# Patient Record
Sex: Male | Born: 1980 | Race: White | Hispanic: No | Marital: Married | Smoking: Never smoker
Health system: Southern US, Community
[De-identification: ages and names within clinical notes are randomized; demographics above are authoritative.]

## PROBLEM LIST (undated history)

## (undated) DIAGNOSIS — K219 Gastro-esophageal reflux disease without esophagitis: Secondary | ICD-10-CM

## (undated) DIAGNOSIS — G4733 Obstructive sleep apnea (adult) (pediatric): Secondary | ICD-10-CM

## (undated) DIAGNOSIS — E669 Obesity, unspecified: Secondary | ICD-10-CM

## (undated) DIAGNOSIS — R748 Abnormal levels of other serum enzymes: Secondary | ICD-10-CM

## (undated) DIAGNOSIS — Z9989 Dependence on other enabling machines and devices: Secondary | ICD-10-CM

## (undated) DIAGNOSIS — T7840XA Allergy, unspecified, initial encounter: Secondary | ICD-10-CM

## (undated) DIAGNOSIS — M722 Plantar fascial fibromatosis: Secondary | ICD-10-CM

## (undated) HISTORY — DX: Dependence on other enabling machines and devices: Z99.89

## (undated) HISTORY — DX: Obesity, unspecified: E66.9

## (undated) HISTORY — PX: EYE SURGERY: SHX253

## (undated) HISTORY — DX: Allergy, unspecified, initial encounter: T78.40XA

## (undated) HISTORY — DX: Obstructive sleep apnea (adult) (pediatric): G47.33

## (undated) HISTORY — DX: Plantar fascial fibromatosis: M72.2

## (undated) HISTORY — DX: Gastro-esophageal reflux disease without esophagitis: K21.9

---

## 2012-04-27 ENCOUNTER — Ambulatory Visit (INDEPENDENT_AMBULATORY_CARE_PROVIDER_SITE_OTHER): Payer: BC Managed Care – PPO | Admitting: Physician Assistant

## 2012-04-27 VITALS — BP 122/81 | HR 88 | Temp 98.1°F | Resp 16 | Ht 71.0 in | Wt 273.0 lb

## 2012-04-27 DIAGNOSIS — K219 Gastro-esophageal reflux disease without esophagitis: Secondary | ICD-10-CM

## 2012-04-27 DIAGNOSIS — J309 Allergic rhinitis, unspecified: Secondary | ICD-10-CM

## 2012-04-27 DIAGNOSIS — J069 Acute upper respiratory infection, unspecified: Secondary | ICD-10-CM

## 2012-04-27 DIAGNOSIS — G4733 Obstructive sleep apnea (adult) (pediatric): Secondary | ICD-10-CM

## 2012-04-27 DIAGNOSIS — Z9989 Dependence on other enabling machines and devices: Secondary | ICD-10-CM | POA: Insufficient documentation

## 2012-04-27 MED ORDER — IPRATROPIUM BROMIDE 0.03 % NA SOLN: 2.0000 | Freq: Two times a day (BID) | NASAL | Status: DC

## 2012-04-27 MED ORDER — BENZONATATE 100 MG PO CAPS: 100.0000 mg | ORAL_CAPSULE | Freq: Three times a day (TID) | ORAL | Status: DC | PRN

## 2012-04-27 MED ORDER — GUAIFENESIN ER 1200 MG PO TB12: 1.0000 | ORAL_TABLET | Freq: Two times a day (BID) | ORAL | Status: DC | PRN

## 2012-04-27 NOTE — Progress Notes (Signed)
Subjective:    Patient ID: Steven Lee, male    DOB: 1980/11/23, 32 y.o.   MRN: [id number]  HPI This 31 y.o. male presents for evaluation of a slight cough and allergies or sinus.  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  Has taken Nyquil the last few nights with a little bit of improvement.  Symptoms began 04/23/2012, were worst on 04/25/2012.  No fever, chills, GI/GU symptoms.  No unexplained muscle pain or joint pain.   No HA, dizziness, facial pain or dental pain.  He has self diagnoses OSA and uses a CPAP that he borrows from a family member.  He notes it's not working as well as it used to and would like to have formal evaluation.  He intends to establish with a PCP soon.  Review of Systems As above.  No chest pain, SOB, vision changes.   Past Medical History  Diagnosis Date  . Allergy   . GERD (gastroesophageal reflux disease)     Past Surgical History  Procedure Date  . Eye surgery     PRK bilaterally    Prior to Admission medications   Medication Sig Start Date End Date Taking? Authorizing Provider  omeprazole (PRILOSEC OTC) 20 MG tablet Take 20 mg by mouth daily.   Yes Historical Provider, MD  benzonatate (TESSALON) 100 MG capsule Take 1-2 capsules (100-200 mg total) by mouth 3 (three) times daily as needed for cough. 04/27/12   Deontrae Drinkard S Camarie Mctigue, PA-C  Guaifenesin (MUCINEX MAXIMUM STRENGTH) 1200 MG TB12 Take 1 tablet (1,200 mg total) by mouth every 12 (twelve) hours as needed. 04/27/12   Brodi Nery S Diane Mochizuki, PA-C  ipratropium (ATROVENT) 0.03 % nasal spray Place 2 sprays into the nose 2 (two) times daily. 04/27/12   Crystallee Werden Tessa Lerner, PA-C  Multiple Vitamin (MULTIVITAMIN) tablet Take 1 tablet by mouth daily.   Yes Historical Provider, MD    No Known Allergies  History   Social History  . Marital Status: Single    Spouse Name: Steven Lee    Number of Children: 0  . Years of Education: 16   Occupational History  . 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 Smoking status: Never Smoker   . Smokeless tobacco: Never Used  . Alcohol Use: 1.0 - 1.5 oz/week    2-3 drink(s) per week  . Drug Use: No  . Sexually Active: Yes -- Male partner(s)   Other Topics Concern  . Not on file   Social History Narrative   Lives with his fiance    Family History  Problem Relation Age of Onset  . Mental illness Sister     bipolar       Objective:   Physical Exam Blood pressure 122/81, pulse 88, temperature 98.1 F (36.7 C), temperature source Oral, resp. rate 16, height 5\' 11"  (1.803 m), weight 273 lb (123.832 kg), SpO2 97.00%. Body mass index is 38.08 kg/(m^2). Well-developed, well nourished WM who is awake, alert and oriented, in NAD. HEENT: [place]/AT, PERRL, EOMI.  Sclera and conjunctiva are clear.  EAC are patent, TMs are normal in appearance. Nasal mucosa is pink and moist. OP is clear. No frontal or maxillary sinus tenderness. Neck: supple, non-tender, no lymphadenopathy, thyromegaly. Heart: RRR, no murmur Lungs: normal effort, CTA Extremities: no cyanosis, clubbing or edema. Skin: warm and dry without rash. Psychologic: good mood and appropriate affect, normal speech and behavior.  Assessment & Plan:   1. AR (allergic rhinitis)    2. GERD (gastroesophageal reflux disease)    3. Acute upper respiratory infections of unspecified site  benzonatate (TESSALON) 100 MG capsule, ipratropium (ATROVENT) 0.03 % nasal spray, Guaifenesin (MUCINEX MAXIMUM STRENGTH) 1200 MG TB12  4. OSA on CPAP  He needs a sleep study to titrate-will establish with PCP, but if it's going to be several months, he'll call and I'll make the referral.

## 2012-04-27 NOTE — Patient Instructions (Signed)
Get plenty of rest and drink at least 64 ounces of water daily. 

## 2012-10-02 ENCOUNTER — Encounter: Payer: Self-pay | Admitting: Neurology

## 2012-10-08 NOTE — Progress Notes (Signed)
Quick Note:  100% compliance. With CPAP. Will discuss during visit.   ______

## 2012-11-06 ENCOUNTER — Institutional Professional Consult (permissible substitution): Payer: BC Managed Care – PPO | Admitting: Neurology

## 2012-11-09 ENCOUNTER — Encounter: Payer: Self-pay | Admitting: Neurology

## 2012-11-09 ENCOUNTER — Ambulatory Visit (INDEPENDENT_AMBULATORY_CARE_PROVIDER_SITE_OTHER): Payer: BC Managed Care – PPO | Admitting: Neurology

## 2012-11-09 VITALS — BP 136/84 | HR 67 | Resp 14 | Ht 70.5 in | Wt 280.0 lb

## 2012-11-09 DIAGNOSIS — G4733 Obstructive sleep apnea (adult) (pediatric): Secondary | ICD-10-CM

## 2012-11-09 DIAGNOSIS — Z9989 Dependence on other enabling machines and devices: Secondary | ICD-10-CM

## 2012-11-09 NOTE — Assessment & Plan Note (Signed)
Cont use CPAP.  BMI risk factor.

## 2012-11-09 NOTE — Progress Notes (Addendum)
[facility]  Provider:  Dr Vickey Huger Referring Provider: No ref. provider found Primary Care Physician:  Alysia Penna, MD  Chief Complaint  Patient presents with  . Neurologic Problem    Sleep.Marland KitchenRM#11    HPI:  Steven Lee is a 32 y.o. male here as a referral from Dr.Holwerda.  This 32 year old Caucasian right-handed male was originally referred for a sleep study on 06/05/2012. The patient had a past medical history of obesity, GE RD, plantar fasciitis. He also reported having been diagnosed with obstructive sleep apnea and had been using a CPAP machine that he acquired from a family member. He now needed a formal sleep evaluation to have the machine set to his knee. He presented with the complaints of excessive daytime sleepiness, snoring and witnessed apneas. He endorsed the sleepiness score at 14 points the backs score at 7 pints.  The sleep study showed a a HI also known as apnea hypotony index of 97 and an RDI even higher.  the patient's lowest oxygen level was 71%. The average heart rate was 53 beats per minute the patient was titrated to CPAP and responded well to a pressure of 10 cm water. The patient slept mostly in supine position and during the titration only in supine position. I ordered the CPAP to be set at 11 cm water was a 2 cm EPI. I obtained a detailed download from his machine through [facility] , which shows an average AHI of 2.9 at a CPAP pressure of 11 cm, and average usage of the machine at night for 7 hours 53 minutes the 30 day download shows 100% compliance.  Epworth is now 7 Points.  Review of Systems: Out of a complete 14 system review, the patient complains of only the following symptoms, and all other reviewed systems are negative. Sleepiness reduced.snoring and apnea resolved.   The patient endorses a bedtime between 10:30 and 11:30 the am and and has to get up at 5:30 AM.. His fiance has reported less restless sleep as well as  regional snoring and reduced apneas. The   History   Social History  . Marital Status: Single    Spouse Name: [name]    Number of Children: 0  . Years of Education: 16   Occupational History  . 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 Smoking status: Never Smoker   . Smokeless tobacco: Never Used  . Alcohol Use: 1 - 1.5 oz/week    2-3 drink(s) per week  . Drug Use: No  . Sexually Active: Yes -- Male partner(s)   Other Topics Concern  . Not on file   Social History Narrative   Lives with his fiance    Family History  Problem Relation Age of Onset  . Mental illness Sister     bipolar    Past Medical History  Diagnosis Date  . Allergy   . GERD (gastroesophageal reflux disease)   . OSA on CPAP   . Obesity   . Plantar fasciitis     Past Surgical History  Procedure Laterality Date  . Eye surgery      PRK bilaterally    Current Outpatient Prescriptions  Medication Sig Dispense Refill  . Multiple Vitamin (MULTIVITAMIN) tablet Take 1 tablet by mouth daily.      Marland Kitchen omeprazole (PRILOSEC) 20 MG capsule Take 20 mg by mouth daily.       No current facility-administered medications for this  visit.    Allergies as of 11/09/2012  . (No Known Allergies)    Vitals: BP 136/84  Pulse 67  Resp 14  Ht 5' 10.5" (1.791 m)  Wt 280 lb (127.007 kg)  BMI 39.59 kg/m2 Last Weight:  Wt Readings from Last 1 Encounters:  11/09/12 280 lb (127.007 kg)   Last Height:   Ht Readings from Last 1 Encounters:  11/09/12 5' 10.5" (1.791 m)   Vision Screening:  Vitals .  Physical exam:  General: The patient is awake, alert and appears not in acute distress. The patient is well groomed. Head: Normocephalic, atraumatic. Neck is supple. Mallampati 1- neck circumference:18 inches, TMJ positive , clicking in the right. No retrognathia.  Cardiovascular:  Regular rate and rhythm, without  murmurs or carotid bruit, and  without distended neck veins. Respiratory: Lungs are clear to auscultation. Skin:  Without evidence of edema, or rash Trunk: BMI is elevated , patient  has normal posture.  Neurologic exam : The patient is awake and alert, oriented to place and time.  Memory subjective \ described as intact. There is a normal attention span & concentration ability. Speech is fluent without  dysarthria, dysphonia or aphasia. Mood and affect are appropriate.  Cranial nerves: Pupils are equal and briskly reactive to light. Funduscopic exam without  evidence of pallor or edema. Extraocular movements  in vertical and horizontal planes intact and without nystagmus. Visual fields by finger perimetry are intact. Hearing to finger rub intact.  Facial sensation intact to fine touch. Facial motor strength is symmetric and tongue and uvula move midline.  Motor exam:   Normal tone and normal muscle bulk and symmetric normal strength in all extremities.  Sensory:  Fine touch, pinprick and vibration were tested in all extremities. Proprioception is tested in the upper extremities only. This was  normal.  Coordination: Rapid alternating movements in the fingers/hands is tested and normal.   Gait and station: Patient walks without assistive device and is able unassisted tol climb up to the exam table. Strength within normal limits. Deep tendon reflexes: in the  upper and lower extremities are symmetric and intact.  Assessment:  After physical and neurologic examination, review of laboratory studies, imaging, neurophysiology testing and pre-existing records, assessment will be reviewed on the problem list.  Plan:  Treatment plan and additional workup will be reviewed under Problem List.    Resolution of obstructive sleep apnea at a CPAP setting of 11 cm water. The patient is highly motivated his compliance rate is 100% his sleepiness score has declined to 7 points. I would like for [name] to continue the  CPAP use and  pursue at the same time programs for weight loss, as this is his main risk for the sleep apnea.  I would like to add that the patient has a strong family history of sleep apnea in parents and siblings.

## 2012-11-09 NOTE — Patient Instructions (Addendum)
Sleep Apnea  Sleep apnea is a sleep disorder characterized by abnormal pauses in breathing while you sleep. When your breathing pauses, the level of oxygen in your blood decreases. This causes you to move out of deep sleep and into light sleep. As a result, your quality of sleep is poor, and the system that carries your blood throughout your body (cardiovascular system) experiences stress. If sleep apnea remains untreated, the following conditions can develop:  High blood pressure (hypertension).  Coronary artery disease.  Inability to achieve or maintain an erection (impotence).  Impairment of your thought process (cognitive dysfunction). There are three types of sleep apnea: 1. Obstructive sleep apnea Pauses in breathing during sleep because of a blocked airway. 2. Central sleep apnea Pauses in breathing during sleep because the area of the brain that controls your breathing does not send the correct signals to the muscles that control breathing. 3. Mixed sleep apnea A combination of both obstructive and central sleep apnea. RISK FACTORS The following risk factors can increase your risk of developing sleep apnea:  Being overweight.  Smoking.  Having narrow passages in your nose and throat.  Being of older age.  Being male.  Alcohol use.  Sedative and tranquilizer use.  Ethnicity. Among individuals younger than 35 years, African Americans are at increased risk of sleep apnea. SYMPTOMS   Difficulty staying asleep.  Daytime sleepiness and fatigue.  Loss of energy.  Irritability.  Loud, heavy snoring.  Morning headaches.  Trouble concentrating.  Forgetfulness.  Decreased interest in sex. DIAGNOSIS  In order to diagnose sleep apnea, your caregiver will perform a physical examination. Your caregiver may suggest that you take a home sleep test. Your caregiver may also recommend that you spend the night in a sleep lab. In the sleep lab, several monitors record  information about your heart, lungs, and brain while you sleep. Your leg and arm movements and blood oxygen level are also recorded. TREATMENT The following actions may help to resolve mild sleep apnea:  Sleeping on your side.   Using a decongestant if you have nasal congestion.   Avoiding the use of depressants, including alcohol, sedatives, and narcotics.   Losing weight and modifying your diet if you are overweight. There also are devices and treatments to help open your airway:  Oral appliances. These are custom-made mouthpieces that shift your lower jaw forward and slightly open your bite. This opens your airway.  Devices that create positive airway pressure. This positive pressure "splints" your airway open to help you breathe better during sleep. The following devices create positive airway pressure:  Continuous positive airway pressure (CPAP) device. The CPAP device creates a continuous level of air pressure with an air pump. The air is delivered to your airway through a mask while you sleep. This continuous pressure keeps your airway open.  Nasal expiratory positive airway pressure (EPAP) device. The EPAP device creates positive air pressure as you exhale. The device consists of single-use valves, which are inserted into each nostril and held in place by adhesive. The valves create very little resistance when you inhale but create much more resistance when you exhale. That increased resistance creates the positive airway pressure. This positive pressure while you exhale keeps your airway open, making it easier to breath when you inhale again.  Bilevel positive airway pressure (BPAP) device. The BPAP device is used mainly in patients with central sleep apnea. This device is similar to the CPAP device because it also uses an air pump to deliver  continuous air pressure through a mask. However, with the BPAP machine, the pressure is set at two different levels. The pressure when you  exhale is lower than the pressure when you inhale.  Surgery. Typically, surgery is only done if you cannot comply with less invasive treatments or if the less invasive treatments do not improve your condition. Surgery involves removing excess tissue in your airway to create a wider passage way. Document Released: 06/17/2002 Document Revised: 12/27/2011 Document Reviewed: 11/03/2011 [facility] Patient Information 2013 [place], [place]. 1500 Calorie Diabetic Diet The 1500 calorie diabetic diet limits calories to 1500 each day. Following this diet and making healthy meal choices can help improve overall health. It controls blood glucose (sugar) levels and can also help lower blood pressure and cholesterol.  SERVING SIZES Measuring foods and serving sizes helps to make sure you are getting the right amount of food. The list below tells how big or small some common serving sizes are.  1 oz.........4 stacked dice.  3 oz........Marland KitchenDeck of cards.  1 tsp.......Marland KitchenTip of little finger.  1 tbs......Marland KitchenMarland KitchenThumb.  2 tbs.......Marland KitchenGolf ball.   cup......Marland KitchenHalf of a fist.  1 cup.......Marland KitchenA fist. GUIDELINES FOR CHOOSING FOODS The goal of this diet is to eat a variety of foods and limit calories to 1500 each day. This can be done by choosing foods that are low in calories and fat. The diet also suggests eating small amounts of food frequently. Doing this helps control your blood glucose levels, so they do not get too high or too low. Each meal or snack may include a protein food source to help you feel more satisfied. Try to eat about the same amount of food around the same time each day. This includes weekend days, travel days, and days off work. Space your meals about 4 to 5 hours apart, and add a snack between them, if you wish.  For example, a daily food plan could include breakfast, a morning snack, lunch, dinner, and an evening snack. Healthy meals and snacks have different types of foods, including whole grains,  vegetables, fruits, lean meats, poultry, fish, and dairy products. As you plan your meals, select a variety of foods. Choose from the bread and starch, vegetable, fruit, dairy, and meat/protein groups. Examples of foods from each group are listed below, with their suggested serving sizes. Use measuring cups and spoons to become familiar with what a healthy portion looks like. Bread and Starch Each serving equals 15 grams of carbohydrate.  1 slice bread.   bagel.   cup cold cereal (unsweetened).   cup hot cereal or mashed potatoes.  1 small potato (size of a computer mouse).   cup cooked pasta or rice.   English muffin.  1 cup broth-based soup.  3 cups of popcorn.  4 to 6 whole-wheat crackers.   cup cooked beans, peas, or corn. Vegetables Each serving equals 5 grams of carbohydrate.   cup cooked vegetables.  1 cup raw vegetables.   cup tomato or vegetable juice. Fruit Each serving equals 15 grams of carbohydrate.  1 small apple or orange.  1  cup watermelon or strawberries.   cup applesauce (no sugar added).  2 tbs raisins.   banana.   cup canned fruit, packed in water or in its own juice.   cup unsweetened fruit juice. Dairy Each serving equals 12 to 15 grams of carbohydrate.  1 cup fat-free milk.  6 oz artificially sweetened yogurt or plain yogurt.  1 cup low-fat buttermilk.  1 cup soy  milk.  1 cup almond milk. Meat/Protein  1 large egg.  2 to 3 oz meat, poultry, or fish.   cup low-fat cottage cheese.  1 tbs peanut butter.  1 oz low-fat cheese.   cup tuna, packed in water.   cup tofu. Fat  1 tsp oil.  1 tsp trans-fat-free margarine.  1 tsp butter.  1 tsp mayonnaise.  2 tbs avocado.  1 tbs salad dressing.  1 tbs cream cheese.  2 tbs sour cream. SAMPLE 1500 CALORIE DIET PLAN Breakfast   whole-wheat English muffin (1 carb serving).  1 tsp trans-fat-free margarine.  1 scrambled egg.  1 cup fat-free milk  (1 carb serving).  1 small orange (1 carb serving). Lunch  Chicken wrap.  1 whole-wheat tortilla, 8-inch (1 carb servings).  2 oz chicken breast, sliced.  2 tbs low-fat salad dressing, such as [place].   cup shredded lettuce.  2 slices tomato.   cup carrot sticks.  1 small apple (1 carb serving). Afternoon Snack  3 graham cracker squares (1 carb serving).  1 tbs peanut butter. Dinner  2 oz lean pork chop, broiled.  1 cup brown rice (3 carb servings).   cup steamed carrots.   cup green beans.  1 cup fat-free milk (1 carb serving).  1 tsp trans-fat-free margarine. Evening Snack   cup low-fat cottage cheese.  1 small peach or pear, sliced (or  cup canned in water) (1 carb serving). MEAL PLAN You can use this worksheet to help you make a daily meal plan based on the 1500 calorie diabetic diet suggestions. If you are using this plan to help you control your blood glucose, you may interchange carbohydrate containing foods (dairy, starches, and fruits). Select a variety of fresh foods of varying colors and flavors. The total amount of carbohydrate in your meals or snacks is more important than making sure you include all of the food groups every time you eat. You can choose from approximately this many of the following foods to build your day's meals:  6 Starches.  3 Vegetables.  2 Fruits.  2 Dairy.  4 to 6 oz Meat/Protein.  Up to 3 Fats. Your dietician can use this worksheet to help you decide how many servings and which types of foods are right for you. BREAKFAST Food Group and Servings / Food Choice Starch _________________________________________________________ Dairy __________________________________________________________ Fruit ___________________________________________________________ Meat/Protein____________________________________________________ Fat ____________________________________________________________ LUNCH Food Group and Servings  / Food Choice  Starch _________________________________________________________ Meat/Protein ___________________________________________________ Vegetables _____________________________________________________ Fruit __________________________________________________________ Dairy __________________________________________________________ Fat ____________________________________________________________ Aura Fey Food Group and Servings / Food Choice Dairy __________________________________________________________ Starch _________________________________________________________ Meat/Protein____________________________________________________ Zada Girt ___________________________________________________________ Laural Golden Food Group and Servings / Food Choice Starch _________________________________________________________ Meat/Protein ___________________________________________________ Dairy __________________________________________________________ Vegetable ______________________________________________________ Fruit ___________________________________________________________ Fat ____________________________________________________________ Lollie Sails Food Group and Servings / Food Choice Fruit ___________________________________________________________ Meat/Protein ____________________________________________________ Dairy __________________________________________________________ Starch __________________________________________________________ DAILY TOTALS Starches _________________________ Vegetables _______________________ Fruits ____________________________ Dairy ____________________________ Meat/Protein_____________________ Fats _____________________________ Document Released: 01/17/2005 Document Revised: 09/19/2011 Document Reviewed: 05/14/2009 [employer] Patient Information 2013 [place], [place].

## 2013-01-29 ENCOUNTER — Other Ambulatory Visit: Payer: Self-pay | Admitting: Internal Medicine

## 2013-01-29 DIAGNOSIS — R7401 Elevation of levels of liver transaminase levels: Secondary | ICD-10-CM

## 2013-01-31 ENCOUNTER — Other Ambulatory Visit: Payer: Self-pay

## 2013-02-01 ENCOUNTER — Ambulatory Visit
Admission: RE | Admit: 2013-02-01 | Discharge: 2013-02-01 | Disposition: A | Discharge status: Home / Self Care | Payer: BC Managed Care – PPO | Source: Ambulatory Visit | Attending: Internal Medicine | Admitting: Internal Medicine

## 2013-02-01 DIAGNOSIS — K30 Functional dyspepsia: Secondary | ICD-10-CM

## 2013-02-01 DIAGNOSIS — R7401 Elevation of levels of liver transaminase levels: Secondary | ICD-10-CM

## 2013-03-04 ENCOUNTER — Ambulatory Visit (INDEPENDENT_AMBULATORY_CARE_PROVIDER_SITE_OTHER): Payer: Self-pay | Admitting: Surgery

## 2013-03-04 ENCOUNTER — Encounter (INDEPENDENT_AMBULATORY_CARE_PROVIDER_SITE_OTHER): Payer: Self-pay | Admitting: Surgery

## 2013-03-12 ENCOUNTER — Encounter (INDEPENDENT_AMBULATORY_CARE_PROVIDER_SITE_OTHER): Payer: Self-pay | Admitting: Surgery

## 2013-03-12 ENCOUNTER — Ambulatory Visit (INDEPENDENT_AMBULATORY_CARE_PROVIDER_SITE_OTHER): Payer: BC Managed Care – PPO | Admitting: Surgery

## 2013-03-12 VITALS — BP 110/70 | HR 68 | Temp 97.4°F | Resp 18 | Ht 70.0 in | Wt 276.6 lb

## 2013-03-12 DIAGNOSIS — E210 Primary hyperparathyroidism: Secondary | ICD-10-CM

## 2013-03-12 DIAGNOSIS — E213 Hyperparathyroidism, unspecified: Secondary | ICD-10-CM

## 2013-03-12 NOTE — Progress Notes (Signed)
General Surgery [facility]  Chief Complaint  Patient presents with  . New Evaluation    primary hyperparathyroidism - referral from Dr. Alysia Penna, [facility]    HISTORY: Patient is a 32 year old male referred by his primary care physician for evaluation of hypercalcemia and suspected primary hyperparathyroidism. Patient was found on routine screening lab work to have an elevated serum calcium level of 10.9. Subsequent intact PTH level was found to be elevated at 84. 24-hour urine collection for calcium was elevated greater than 400. Vitamin D level was low at 24.  Patient notes mild fatigue. He denies nephrolithiasis. He has never had a bone density scan. He denies bone or joint pain.  Patient has not had any prior neck surgery. There is no family history of endocrine disease.  Past Medical History  Diagnosis Date  . Allergy   . GERD (gastroesophageal reflux disease)   . OSA on CPAP   . Obesity   . Plantar fasciitis     Current Outpatient Prescriptions  Medication Sig Dispense Refill  . Multiple Vitamin (MULTIVITAMIN) tablet Take 1 tablet by mouth daily.      Marland Kitchen omeprazole (PRILOSEC) 20 MG capsule Take 20 mg by mouth daily.       No current facility-administered medications for this visit.    No Known Allergies  Family History  Problem Relation Age of Onset  . Mental illness Sister     bipolar    History   Social History  . Marital Status: Married    Spouse Name: [name]    Number of Children: 0  . Years of Education: 16   Occupational History  . 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 Smoking status: Never Smoker   . Smokeless tobacco: Never Used  . Alcohol Use: 1 - 1.5 oz/week    2-3 drink(s) per week  . Drug Use: No  . Sexual Activity: Yes    Partners: Female   Other Topics Concern  . None   Social History Narrative   Lives with his fiance     REVIEW OF SYSTEMS - PERTINENT POSITIVES ONLY: Mild fatigue. Denies nephrolithiasis. Denies bone disease.  EXAM: Filed Vitals:   03/12/13 1623  BP: 110/70  Pulse: 68  Temp: 97.4 F (36.3 C)  Resp: 18    HEENT: normocephalic; pupils equal and reactive; sclerae clear; dentition good; mucous membranes moist NECK:  symmetric on extension; no palpable anterior or posterior cervical lymphadenopathy; no supraclavicular masses; no tenderness CHEST: clear to auscultation bilaterally without rales, rhonchi, or wheezes CARDIAC: regular rate and rhythm without significant murmur; peripheral pulses are full EXT:  non-tender without edema; no deformity NEURO: no gross focal deficits; no sign of tremor   LABORATORY RESULTS: See [facility] (CHL-Epic) for most recent results  RADIOLOGY RESULTS: See [facility] (CHL-Epic) for most recent results  IMPRESSION: Primary hyperparathyroidism  PLAN: I had a lengthy discussion with the patient and his wife. I provided him with written literature to review on hyperparathyroidism. I have recommended that he undergo a nuclear medicine parathyroid scan in hopes of localizing a parathyroid adenoma and confirming the diagnosis. We discussed alternative radiologic studies if the sestamibi scan is not revealing. We discussed parathyroid surgery in the options for surgical management. If the sestamibi scan is positive, I would recommend a minimally invasive parathyroidectomy. We discussed the risk and benefits of this procedure.  Patient will be scheduled  for sestamibi scan. I will contact him with the results. We will make a decision then about further evaluation versus surgery.  Velora Heckler, MD, FACS General & Endocrine Surgery [facility]  Primary Care Physician: Alysia Penna, MD

## 2013-03-12 NOTE — Patient Instructions (Signed)
Parathyroidectomy A parathyroidectomy is surgery to remove one or more parathyroid glands. These glands produce a hormone (parathyroid hormone) that helps control the level of calcium in your body. The glands are very small, about the size of a pea. They are located in your neck, close to your thyroid gland and your Adam's apple. Most people (85%) have four parathyroid glands,some people may have one or two more than that. Hyperparathyroidism is when too much parathyroid hormone is being produced. Usually this is caused by one of the parathyroid glands becoming enlarged, but it can also be caused by more than one of the glands. Hyperparathyroidism is found during blood tests that show high calcium in the blood. Parathyroid hormone levels will also be elevated. Cancer also can cause hyperparathyroidism, but this is rare. For the most common type of hyperparathyroidism, the treatment is surgical removal of the parathyroid gland that is enlarged. For patients with kidney failure and hyperparathyroidism, other treatment will be tried before surgery is done on the parathyroid.  Many times x-ray studies are done to find out which parathyroid gland or glands is malfunctioning. The decision about the best treatment for hyperparathyroidism is between the patient, their primary doctor, an endocrinologist, and a surgeon experienced in parathyroid surgery. LET YOUR CAREGIVER KNOW ABOUT:  Any allergies.  All medications you are taking, including:  Herbs, eyedrops, over-the-counter medications and creams.  Blood thinners (anticoagulants), aspirin or other drugs that could affect blood clotting.  Use of steroids (by mouth or as creams).  Previous problems with anesthetics, including local anesthetics.  Possibility of pregnancy, if this applies.  Any history of blood clots.  Any history of bleeding or other blood problems.  Previous surgery.  Smoking history.  Other health problems. RISKS AND  COMPLICATIONS   Short-term possibilities include:  Excessive bleeding.  Pain.  Infection near the incision.  Slow healing.  Pooling of blood under the wound (hematoma).  Damage to nerves in your neck.  Blood clots.  Difficulty breathing. This is very rare. It also is almost always temporary.  Longer-term possibilities include:  Scarring.  Skin damage.  Damage to blood vessels in the area.  Need for additional surgery.  A hoarse or weak voice. This is usually temporary. It can be the result of nerve damage.  Development of hypoparathyroidism. This means you are not making enough parathyroid hormone. It is rare. If it occurs, you will need to take calcium supplements daily. BEFORE THE PROCEDURE  Sometimes the surgery is done on an outpatient basis. This means you could go home the same day as your surgery. Other times, people need to stay in the hospital overnight. Ask your surgeon what you should expect.  If your surgery will be an outpatient procedure, arrange for someone to drive you home after the surgery.  Two weeks before your surgery, stop using aspirin and non-steroidal anti-inflammatory drugs (NSAID's) for pain relief. This includes prescription drugs and over-the-counter drugs such as ibuprofen and naproxen. Also stop taking vitamin E.  If you take blood-thinners, ask your healthcare provider when you should stop taking them.  Do not eat or drink for about 8 hours before your surgery.  You might be asked to shower or wash with a special antibacterial soap before the procedure.  Arrive at least an hour before the surgery, or whenever your surgeon recommends. This will give you time to check in and fill out any needed paperwork. PROCEDURE  The preparation:  You will change into a hospital gown.  You   will be given an IV. A needle will be inserted in your arm. Medication will be able to flow directly into your body through this needle.  You might be given a  sedative to help you relax.  You will be given a drug that puts you to sleep during the surgery (general anesthetic).  The procedure:  Once you are asleep, the surgeon will make a small cut (incision) in your lower neck. Ask your surgeon where the incision will be.  The surgeon will look for the gland(s) that are not working well. Often a tissue sample from a gland is used to determine this.  Any glands that are not working well will be removed.  The surgeon will close the incision with stitches, often these are hidden under the skin. AFTER THE PROCEDURE  You will stay in a recovery area until the anesthesia has worn off. Your blood pressure and heart rate will be checked.  If your surgery was an outpatient procedure, you will go home the same day.  If you need to stay in the hospital, you will be moved to a hospital room. You will probably stay for two to three days. This will depend on how quickly you recover.  While you are in the hospital, your blood will be tested to check the calcium levels in your body. HOME CARE INSTRUCTIONS   Take any medication that your surgeon prescribes. Follow the directions carefully. Take all of the medication.  Ask your surgeon whether you can take over-the-counter medicines for pain, discomfort or fever. Do not take aspirin without permission from the surgeon. Aspirin increases the chances of bleeding.  Do not get the wound wet for the first few days after surgery (or until the surgeon tells you it is OK).  After this procedure, many patients may develop low calcium levels in the blood. It is critical that you see your medical caregiver to have this monitored and managed.     SEEK MEDICAL CARE IF:   You notice blood or fluid leaking from the wound, or it becomes red or swollen.  You have trouble breathing.  You have trouble speaking.  You become nauseous or throw up for more than two days after the surgery.  You have a fever or  persistent symptoms for more than 2-3 days. SEEK IMMEDIATE MEDICAL CARE IF:   Breathing becomes more difficult.  You have a fever and your symptoms suddenly get worse. Document Released: 09/23/2008 Document Revised: 06/13/2012 Document Reviewed: 09/23/2008 ExitCare Patient Information 2014 [employer].  

## 2013-03-18 ENCOUNTER — Encounter (HOSPITAL_COMMUNITY)
Admission: RE | Admit: 2013-03-18 | Discharge: 2013-03-18 | Disposition: A | Discharge status: Home / Self Care | Payer: BC Managed Care – PPO | Source: Ambulatory Visit | Attending: Surgery | Admitting: Surgery

## 2013-03-18 DIAGNOSIS — E213 Hyperparathyroidism, unspecified: Secondary | ICD-10-CM

## 2013-03-18 DIAGNOSIS — R948 Abnormal results of function studies of other organs and systems: Secondary | ICD-10-CM | POA: Insufficient documentation

## 2013-03-18 MED ORDER — TECHNETIUM TC 99M SESTAMIBI - CARDIOLITE
25.7000 | Freq: Once | INTRAVENOUS | Status: AC | PRN
  Administered 2013-03-18: 26 via INTRAVENOUS

## 2013-03-21 ENCOUNTER — Telehealth (INDEPENDENT_AMBULATORY_CARE_PROVIDER_SITE_OTHER): Payer: Self-pay

## 2013-03-21 NOTE — Telephone Encounter (Signed)
Scan result here to [facility] for review.

## 2013-03-22 NOTE — Telephone Encounter (Signed)
Pt calling to get results on the Parathyroid scan. I advised pt that the results are here waiting for the review from Dr Gerrit Friends. We will call pt once Dr Gerrit Friends reviews the results.

## 2013-03-25 ENCOUNTER — Encounter (INDEPENDENT_AMBULATORY_CARE_PROVIDER_SITE_OTHER): Payer: Self-pay

## 2013-03-28 ENCOUNTER — Encounter: Payer: Self-pay | Admitting: Neurology

## 2013-04-03 ENCOUNTER — Telehealth (INDEPENDENT_AMBULATORY_CARE_PROVIDER_SITE_OTHER): Payer: Self-pay | Admitting: Surgery

## 2013-04-03 ENCOUNTER — Other Ambulatory Visit (INDEPENDENT_AMBULATORY_CARE_PROVIDER_SITE_OTHER): Payer: Self-pay | Admitting: Surgery

## 2013-04-03 DIAGNOSIS — E210 Primary hyperparathyroidism: Secondary | ICD-10-CM

## 2013-04-03 NOTE — Telephone Encounter (Signed)
Noted. Posting sheet to schedulers.

## 2013-04-03 NOTE — Telephone Encounter (Signed)
Telephone call to patient with results of nuclear medicine parathyroid scan. 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 Patient will be a good candidate for minimally invasive surgery. Orders will be placed and the patient will be contacted by our scheduler is to arrange for a date for surgery.  Velora Heckler, MD, [facility] Office: [phone]

## 2013-05-20 ENCOUNTER — Other Ambulatory Visit (HOSPITAL_COMMUNITY): Payer: Self-pay | Admitting: Surgery

## 2013-05-20 NOTE — Patient Instructions (Addendum)
19 Steven Lee  05/20/2013   Your procedure is scheduled on: Thursday November 20th  Report to [facility] at 915 AM.  Call this number if you have problems the morning of surgery [phone]   Remember:Bring CPAP mask and tubing   Do not eat food or drink liquids :After Midnight.     Take these medicines the morning of surgery with A SIP OF WATER: Omeprazole                                SEE [place] PREPARING FOR SURGERY SHEET             You may not have any metal on your body including hair pins and piercings  Do not wear jewelry, make-up.  Do not wear lotions, powders, or perfumes. You may wear deodorant.   Men may shave face and neck.  Do not bring valuables to the hospital. [facility] IS NOT RESPONSIBLE FOR VALUEABLES.  Contacts, dentures or bridgework may not be worn into surgery.  Leave suitcase in the car. After surgery it may be brought to your room.  For patients admitted to the hospital, checkout time is 11:00 AM the day of discharge.   Patients discharged the day of surgery will not be allowed to drive home.  Name and phone number of your driver:  Special Instructions: N/A   Please read over the following fact sheets that you were given:   Call Merleen Nicely RN pre op nurse if needed [phone]    FAILURE TO FOLLOW THESE INSTRUCTIONS MAY RESULT IN THE CANCELLATION OF YOUR SURGERY.  PATIENT SIGNATURE___________________________________________  NURSE SIGNATURE_____________________________________________

## 2013-05-21 ENCOUNTER — Encounter (HOSPITAL_COMMUNITY): Payer: Self-pay | Admitting: Pharmacy Technician

## 2013-05-21 ENCOUNTER — Encounter (HOSPITAL_COMMUNITY): Payer: Self-pay

## 2013-05-21 ENCOUNTER — Encounter (HOSPITAL_COMMUNITY)
Admission: RE | Admit: 2013-05-21 | Discharge: 2013-05-21 | Disposition: A | Discharge status: Home / Self Care | Payer: BC Managed Care – PPO | Source: Ambulatory Visit | Attending: Surgery | Admitting: Surgery

## 2013-05-21 DIAGNOSIS — Z01812 Encounter for preprocedural laboratory examination: Secondary | ICD-10-CM | POA: Insufficient documentation

## 2013-05-21 LAB — CBC
Hemoglobin: 15.6 g/dL (ref 13.0–17.0)
MCH: 30.4 pg (ref 26.0–34.0)
MCHC: 34.5 g/dL (ref 30.0–36.0)
MCV: 88.1 fL (ref 78.0–100.0)
RBC: 5.13 MIL/uL (ref 4.22–5.81)

## 2013-05-24 NOTE — Progress Notes (Signed)
Labs from Dr. Kinnie Scales on chart-05/14/2013-CBC w/diff., PT/INR,PTT,CMP,Ferritin From 05/20/2013-Protein,Electrophoresis and Total protein,Serum,Ceruloplasmin,Alpha 1 Antitrypsin Phenotype,PT/INR, PTT,Iron and IBC, Mononucleosis

## 2013-05-27 ENCOUNTER — Other Ambulatory Visit: Payer: Self-pay | Admitting: Radiology

## 2013-05-27 ENCOUNTER — Encounter (HOSPITAL_COMMUNITY): Payer: Self-pay | Admitting: Pharmacy Technician

## 2013-05-27 ENCOUNTER — Telehealth (INDEPENDENT_AMBULATORY_CARE_PROVIDER_SITE_OTHER): Payer: Self-pay | Admitting: Surgery

## 2013-05-27 ENCOUNTER — Other Ambulatory Visit (HOSPITAL_COMMUNITY): Payer: Self-pay | Admitting: Gastroenterology

## 2013-05-27 DIAGNOSIS — K760 Fatty (change of) liver, not elsewhere classified: Secondary | ICD-10-CM

## 2013-05-27 NOTE — Telephone Encounter (Signed)
Pt called in cancelled sx for 11/20 due to increased liver enzymes

## 2013-05-30 ENCOUNTER — Ambulatory Visit (HOSPITAL_COMMUNITY)
Admission: RE | Admit: 2013-05-30 | Discharge: 2013-05-30 | Disposition: A | Discharge status: Home / Self Care | Payer: BC Managed Care – PPO | Source: Ambulatory Visit | Attending: Gastroenterology | Admitting: Gastroenterology

## 2013-05-30 ENCOUNTER — Encounter (HOSPITAL_COMMUNITY): Admission: RE | Payer: Self-pay | Source: Ambulatory Visit

## 2013-05-30 ENCOUNTER — Ambulatory Visit (HOSPITAL_COMMUNITY): Admission: RE | Admit: 2013-05-30 | Payer: BC Managed Care – PPO | Source: Ambulatory Visit | Admitting: Surgery

## 2013-05-30 ENCOUNTER — Encounter (HOSPITAL_COMMUNITY): Payer: Self-pay

## 2013-05-30 DIAGNOSIS — E669 Obesity, unspecified: Secondary | ICD-10-CM | POA: Insufficient documentation

## 2013-05-30 DIAGNOSIS — M722 Plantar fascial fibromatosis: Secondary | ICD-10-CM | POA: Insufficient documentation

## 2013-05-30 DIAGNOSIS — K760 Fatty (change of) liver, not elsewhere classified: Secondary | ICD-10-CM

## 2013-05-30 DIAGNOSIS — Z01812 Encounter for preprocedural laboratory examination: Secondary | ICD-10-CM | POA: Insufficient documentation

## 2013-05-30 DIAGNOSIS — K219 Gastro-esophageal reflux disease without esophagitis: Secondary | ICD-10-CM | POA: Insufficient documentation

## 2013-05-30 DIAGNOSIS — K7689 Other specified diseases of liver: Secondary | ICD-10-CM | POA: Insufficient documentation

## 2013-05-30 DIAGNOSIS — G4733 Obstructive sleep apnea (adult) (pediatric): Secondary | ICD-10-CM | POA: Insufficient documentation

## 2013-05-30 LAB — CBC
HCT: 42.6 % (ref 39.0–52.0)
MCH: 31.4 pg (ref 26.0–34.0)
MCV: 89.1 fL (ref 78.0–100.0)
Platelets: 228 10*3/uL (ref 150–400)
RBC: 4.78 MIL/uL (ref 4.22–5.81)
WBC: 4.8 10*3/uL (ref 4.0–10.5)

## 2013-05-30 LAB — PROTIME-INR: Prothrombin Time: 12 seconds (ref 11.6–15.2)

## 2013-05-30 SURGERY — PARATHYROIDECTOMY
Anesthesia: General | Site: Neck

## 2013-05-30 MED ORDER — FENTANYL CITRATE 0.05 MG/ML IJ SOLN
INTRAMUSCULAR | Status: AC
  Filled 2013-05-30: qty 4

## 2013-05-30 MED ORDER — SODIUM CHLORIDE 0.9 % IV SOLN: Freq: Once | INTRAVENOUS | Status: DC

## 2013-05-30 MED ORDER — MIDAZOLAM HCL 2 MG/2ML IJ SOLN
INTRAMUSCULAR | Status: AC | PRN
  Administered 2013-05-30 (×3): 1 mg via INTRAVENOUS

## 2013-05-30 MED ORDER — FENTANYL CITRATE 0.05 MG/ML IJ SOLN
INTRAMUSCULAR | Status: AC | PRN
  Administered 2013-05-30: 50 ug via INTRAVENOUS
  Administered 2013-05-30: 25 ug via INTRAVENOUS

## 2013-05-30 MED ORDER — HYDROCODONE-ACETAMINOPHEN 5-325 MG PO TABS: 1.0000 | ORAL_TABLET | ORAL | Status: DC | PRN

## 2013-05-30 MED ORDER — MIDAZOLAM HCL 2 MG/2ML IJ SOLN
INTRAMUSCULAR | Status: AC
  Filled 2013-05-30: qty 4

## 2013-05-30 NOTE — Progress Notes (Signed)
REPORT TO Tanya Nones

## 2013-05-30 NOTE — H&P (Signed)
I agree with Ralene Muskrat, PA's note.  Plan to proceed with random liver biopsy under US guidance.   Signed,  Sterling Big, MD [facility]

## 2013-05-30 NOTE — H&P (Signed)
Steven Lee is an 32 y.o. male.   Chief Complaint: Scheduled for liver core biopsy Hx of elevated liver function tests since 01/2013 persistent elevation [place] abd 01/2013 shows fatty liver HPI: GERD; OSA; fatty liver  Past Medical History  Diagnosis Date  . Allergy   . GERD (gastroesophageal reflux disease)   . OSA on CPAP   . Obesity   . Plantar fasciitis     Past Surgical History  Procedure Laterality Date  . Eye surgery      PRK bilaterally    Family History  Problem Relation Age of Onset  . Mental illness Sister     bipolar   Social History:  reports that he has never smoked. He has never used smokeless tobacco. He reports that he drinks about 1.0 ounces of alcohol per week. He reports that he does not use illicit drugs.  Allergies: No Known Allergies   (Not in a hospital admission)  No results found for this or any previous visit (from the past 48 hour(s)). No results found.  Review of Systems  Constitutional: Negative for fever and weight loss.  Respiratory: Negative for sputum production.   Cardiovascular: Negative for chest pain.  Gastrointestinal: Negative for nausea, vomiting and abdominal pain.  Neurological: Negative for dizziness, weakness and headaches.  Psychiatric/Behavioral: Negative for substance abuse.    Blood pressure 120/64, pulse 64, temperature 98.2 F (36.8 C), temperature source Oral, resp. rate 18, height 5\' 10"  (1.778 m), weight 272 lb (123.378 kg), SpO2 99.00%. Physical Exam  Constitutional: He is oriented to person, place, and time. He appears well-developed and well-nourished.  Cardiovascular: Normal rate, regular rhythm and normal heart sounds.   No murmur heard. Respiratory: Effort normal and breath sounds normal. He has no wheezes.  GI: Soft. Bowel sounds are normal. There is no tenderness.  Musculoskeletal: Normal range of motion.  Neurological: He is alert and oriented to person, place, and time.  Skin: Skin is warm and  dry.  Psychiatric: He has a normal mood and affect. His behavior is normal. Judgment and thought content normal.     Assessment/Plan Elevated liver function tests since 01/2013 Fatty liver on [place] Scheduled for liver core biopsy Pt aware of procedure benefits and risks and agreeable to proceed Consent signed and in chart  Steven Lee A 05/30/2013, 9:47 AM

## 2013-05-30 NOTE — Procedures (Signed)
Interventional Radiology Procedure Note  Procedure: US guided random core biopsy of the liver  Complications: None Recommendations: - Bedrest x 3 hrs - May ADAT after 13:00 - Path pending   Signed,  Sterling Big, MD [facility]

## 2013-06-11 ENCOUNTER — Encounter (INDEPENDENT_AMBULATORY_CARE_PROVIDER_SITE_OTHER): Payer: Self-pay | Admitting: Surgery

## 2013-06-11 ENCOUNTER — Ambulatory Visit (INDEPENDENT_AMBULATORY_CARE_PROVIDER_SITE_OTHER): Payer: BC Managed Care – PPO | Admitting: Surgery

## 2013-06-11 VITALS — BP 130/74 | HR 74 | Temp 97.8°F | Resp 16 | Ht 70.0 in | Wt 274.0 lb

## 2013-06-11 DIAGNOSIS — E210 Primary hyperparathyroidism: Secondary | ICD-10-CM

## 2013-06-11 NOTE — Patient Instructions (Signed)

## 2013-06-11 NOTE — Progress Notes (Signed)
General Surgery [facility]  Chief Complaint  Patient presents with  . Follow-up    primary hyperparathyroidism    HISTORY: The patient is a 32 year old male with primary hyperparathyroidism. Nuclear medicine parathyroid scan localizes a parathyroid adenoma to the left inferior position. Patient had been scheduled for surgery. His procedure was canceled due to elevation in his liver enzymes. He has undergone a workup by his gastroenterologist including liver biopsy. 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 He has early changes of fibrosis. He has been cleared by his gastroenterologist to proceed with parathyroid surgery.  EXAM: Anterior examination of the neck shows it to be symmetric. Palpation shows no palpable mass. No lymphadenopathy. No tenderness.  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 He and I reviewed the operation. We discussed his operative course and recovery. We discussed minimally invasive surgery and the possibility of a second gland adenoma at some point in the future. He understands and agrees to proceed.  The risks and benefits of the procedure have been discussed at length with the patient.  The patient understands the proposed procedure, potential alternative treatments, and the course of recovery to be expected.  All of the patient's questions have been answered at this time.  The patient wishes to proceed with surgery.  Velora Heckler, MD, FACS General & Endocrine Surgery [facility]   Visit Diagnoses: 1. Hyperparathyroidism, primary

## 2013-06-13 ENCOUNTER — Encounter (INDEPENDENT_AMBULATORY_CARE_PROVIDER_SITE_OTHER): Payer: Self-pay

## 2013-06-24 ENCOUNTER — Encounter (HOSPITAL_COMMUNITY)
Admission: RE | Admit: 2013-06-24 | Discharge: 2013-06-24 | Disposition: A | Discharge status: Home / Self Care | Payer: BC Managed Care – PPO | Source: Ambulatory Visit | Attending: Surgery | Admitting: Surgery

## 2013-06-24 ENCOUNTER — Encounter (HOSPITAL_COMMUNITY): Payer: Self-pay | Admitting: Pharmacy Technician

## 2013-06-24 ENCOUNTER — Encounter (HOSPITAL_COMMUNITY): Payer: Self-pay

## 2013-06-24 DIAGNOSIS — R748 Abnormal levels of other serum enzymes: Secondary | ICD-10-CM

## 2013-06-24 DIAGNOSIS — M722 Plantar fascial fibromatosis: Secondary | ICD-10-CM

## 2013-06-24 HISTORY — DX: Plantar fascial fibromatosis: M72.2

## 2013-06-24 HISTORY — DX: Abnormal levels of other serum enzymes: R74.8

## 2013-06-24 LAB — COMPREHENSIVE METABOLIC PANEL
ALT: 164 U/L — ABNORMAL HIGH (ref 0–53)
AST: 64 U/L — ABNORMAL HIGH (ref 0–37)
Albumin: 4.1 g/dL (ref 3.5–5.2)
Alkaline Phosphatase: 83 U/L (ref 39–117)
BUN: 16 mg/dL (ref 6–23)
CO2: 23 mEq/L (ref 19–32)
Chloride: 104 mEq/L (ref 96–112)
Creatinine, Ser: 0.87 mg/dL (ref 0.50–1.35)
GFR calc Af Amer: >90 mL/min (ref >90)
GFR calc non Af Amer: >90 mL/min (ref >90)
Glucose, Bld: 90 mg/dL (ref 70–99)
Potassium: 4.1 mEq/L (ref 3.5–5.1)
Total Bilirubin: 0.3 mg/dL (ref 0.3–1.2)

## 2013-06-24 LAB — CBC
HCT: 40.6 % (ref 39.0–52.0)
Hemoglobin: 14.7 g/dL (ref 13.0–17.0)
MCV: 86.8 fL (ref 78.0–100.0)
Platelets: 225 10*3/uL (ref 150–400)
RBC: 4.68 MIL/uL (ref 4.22–5.81)
WBC: 4.8 10*3/uL (ref 4.0–10.5)

## 2013-06-24 NOTE — Patient Instructions (Signed)
20 Maynor Mwangi  06/24/2013   Your procedure is scheduled on: 12-18  -2014  Report to [facility] at     0730   AM.  Call this number if you have problems the morning of surgery: [phone]  Or Presurgical Testing [phone]([place]ers [place])   For Cpap use: bring bring mask/tubing only.   Do not eat food:After Midnight.    Take these medicines the morning of surgery with A SIP OF WATER: Prilosec.   Do not wear jewelry, make-up or nail polish.  Do not wear lotions, powders, or perfumes. You may wear deodorant.  Do not shave 12 hours prior to first CHG shower(legs and under arms).(face and neck okay.)  Do not bring valuables to the hospital.  Contacts, dentures or removable bridgework, body piercing, hair pins may not be worn into surgery.  Leave suitcase in the car. After surgery it may be brought to your room.  For patients admitted to the hospital, checkout time is 11:00 AM the day of discharge.   Patients discharged the day of surgery will not be allowed to drive home. Must have responsible person with you x 24 hours once discharged.  Name and phone number of your driver: Devesh Monforte spouse [phone] cell  Special Instructions: CHG(Chlorhedine 4%-"Hibiclens","Betasept","Aplicare") Shower Use Special Wash: see special instructions.(avoid face and genitals)       Failure to follow these instructions may result in Cancellation of your surgery.   Patient signature_______________________________________________________

## 2013-06-24 NOTE — Pre-Procedure Instructions (Addendum)
06-24-13 1500 Dr. Leta Jungling given status update on pt., will see AM of surgery preop-.recheck CMP today. 06-25-13 Reviewed note from Dr. Cain Saupe acceptable for surgery.

## 2013-06-24 NOTE — Progress Notes (Signed)
Quick Note:  These results are acceptable for scheduled surgery.  Ayeisha Lindenberger M. Malory Spurr, MD, FACS [facility] Office: [phone]   ______ 

## 2013-06-27 ENCOUNTER — Encounter (HOSPITAL_COMMUNITY): Payer: BC Managed Care – PPO | Admitting: Certified Registered Nurse Anesthetist

## 2013-06-27 ENCOUNTER — Telehealth (INDEPENDENT_AMBULATORY_CARE_PROVIDER_SITE_OTHER): Payer: Self-pay

## 2013-06-27 ENCOUNTER — Encounter (HOSPITAL_COMMUNITY): Payer: Self-pay | Admitting: *Deleted

## 2013-06-27 ENCOUNTER — Ambulatory Visit (HOSPITAL_COMMUNITY)
Admission: RE | Admit: 2013-06-27 | Discharge: 2013-06-27 | Disposition: A | Discharge status: Home / Self Care | Payer: BC Managed Care – PPO | Source: Ambulatory Visit | Attending: Surgery | Admitting: Surgery

## 2013-06-27 ENCOUNTER — Other Ambulatory Visit (INDEPENDENT_AMBULATORY_CARE_PROVIDER_SITE_OTHER): Payer: Self-pay

## 2013-06-27 ENCOUNTER — Encounter (HOSPITAL_COMMUNITY)
Admission: RE | Disposition: A | Discharge status: Home / Self Care | Payer: Self-pay | Source: Ambulatory Visit | Attending: Surgery

## 2013-06-27 ENCOUNTER — Ambulatory Visit (HOSPITAL_COMMUNITY): Payer: BC Managed Care – PPO | Admitting: Certified Registered Nurse Anesthetist

## 2013-06-27 DIAGNOSIS — R748 Abnormal levels of other serum enzymes: Secondary | ICD-10-CM | POA: Insufficient documentation

## 2013-06-27 DIAGNOSIS — G4730 Sleep apnea, unspecified: Secondary | ICD-10-CM | POA: Insufficient documentation

## 2013-06-27 DIAGNOSIS — E210 Primary hyperparathyroidism: Secondary | ICD-10-CM | POA: Diagnosis present

## 2013-06-27 DIAGNOSIS — D351 Benign neoplasm of parathyroid gland: Secondary | ICD-10-CM

## 2013-06-27 DIAGNOSIS — K219 Gastro-esophageal reflux disease without esophagitis: Secondary | ICD-10-CM | POA: Insufficient documentation

## 2013-06-27 HISTORY — PX: PARATHYROIDECTOMY: SHX19

## 2013-06-27 SURGERY — PARATHYROIDECTOMY
Anesthesia: General | Site: Neck

## 2013-06-27 MED ORDER — ROCURONIUM BROMIDE 100 MG/10ML IV SOLN
INTRAVENOUS | Status: DC | PRN
  Administered 2013-06-27: 30 mg via INTRAVENOUS

## 2013-06-27 MED ORDER — PROPOFOL 10 MG/ML IV BOLUS
INTRAVENOUS | Status: DC | PRN
  Administered 2013-06-27: 200 mg via INTRAVENOUS

## 2013-06-27 MED ORDER — DEXAMETHASONE SODIUM PHOSPHATE 10 MG/ML IJ SOLN
INTRAMUSCULAR | Status: DC | PRN
  Administered 2013-06-27: 10 mg via INTRAVENOUS

## 2013-06-27 MED ORDER — GLYCOPYRROLATE 0.2 MG/ML IJ SOLN
INTRAMUSCULAR | Status: DC | PRN
  Administered 2013-06-27: 0.6 mg via INTRAVENOUS

## 2013-06-27 MED ORDER — NEOSTIGMINE METHYLSULFATE 1 MG/ML IJ SOLN
INTRAMUSCULAR | Status: AC
  Filled 2013-06-27: qty 10

## 2013-06-27 MED ORDER — HYDROMORPHONE HCL PF 1 MG/ML IJ SOLN
INTRAMUSCULAR | Status: AC
  Filled 2013-06-27: qty 1

## 2013-06-27 MED ORDER — SUCCINYLCHOLINE CHLORIDE 20 MG/ML IJ SOLN
INTRAMUSCULAR | Status: AC
  Filled 2013-06-27: qty 1

## 2013-06-27 MED ORDER — SUCCINYLCHOLINE CHLORIDE 20 MG/ML IJ SOLN
INTRAMUSCULAR | Status: DC | PRN
  Administered 2013-06-27: 100 mg via INTRAVENOUS

## 2013-06-27 MED ORDER — FENTANYL CITRATE 0.05 MG/ML IJ SOLN
INTRAMUSCULAR | Status: AC
  Filled 2013-06-27: qty 2

## 2013-06-27 MED ORDER — BUPIVACAINE HCL 0.25 % IJ SOLN
INTRAMUSCULAR | Status: DC | PRN
  Administered 2013-06-27: 10 mL

## 2013-06-27 MED ORDER — GLYCOPYRROLATE 0.2 MG/ML IJ SOLN
INTRAMUSCULAR | Status: AC
  Filled 2013-06-27: qty 1

## 2013-06-27 MED ORDER — 0.9 % SODIUM CHLORIDE (POUR BTL) OPTIME
TOPICAL | Status: DC | PRN
  Administered 2013-06-27: 1000 mL

## 2013-06-27 MED ORDER — MIDAZOLAM HCL 5 MG/5ML IJ SOLN
INTRAMUSCULAR | Status: DC | PRN
  Administered 2013-06-27: 2 mg via INTRAVENOUS

## 2013-06-27 MED ORDER — ESMOLOL HCL 10 MG/ML IV SOLN
INTRAVENOUS | Status: DC | PRN
  Administered 2013-06-27 (×2): 20 mg via INTRAVENOUS

## 2013-06-27 MED ORDER — OXYCODONE-ACETAMINOPHEN 5-325 MG PO TABS
1.0000 | ORAL_TABLET | ORAL | Status: DC | PRN
Start: 2013-06-27 — End: 2013-06-27

## 2013-06-27 MED ORDER — HYDROMORPHONE HCL PF 1 MG/ML IJ SOLN
0.2500 mg | INTRAMUSCULAR | Status: DC | PRN
  Administered 2013-06-27 (×3): 0.5 mg via INTRAVENOUS

## 2013-06-27 MED ORDER — ESMOLOL HCL 10 MG/ML IV SOLN
INTRAVENOUS | Status: AC
  Filled 2013-06-27: qty 10

## 2013-06-27 MED ORDER — ONDANSETRON HCL 4 MG/2ML IJ SOLN
INTRAMUSCULAR | Status: AC
  Filled 2013-06-27: qty 2

## 2013-06-27 MED ORDER — LACTATED RINGERS IV SOLN
INTRAVENOUS | Status: DC | PRN
  Administered 2013-06-27: 09:00:00 via INTRAVENOUS

## 2013-06-27 MED ORDER — PROPOFOL 10 MG/ML IV BOLUS
INTRAVENOUS | Status: AC
  Filled 2013-06-27: qty 20

## 2013-06-27 MED ORDER — GLYCOPYRROLATE 0.2 MG/ML IJ SOLN
INTRAMUSCULAR | Status: AC
  Filled 2013-06-27: qty 2

## 2013-06-27 MED ORDER — NEOSTIGMINE METHYLSULFATE 1 MG/ML IJ SOLN
INTRAMUSCULAR | Status: DC | PRN
  Administered 2013-06-27: 5 mg via INTRAVENOUS

## 2013-06-27 MED ORDER — MIDAZOLAM HCL 2 MG/2ML IJ SOLN
INTRAMUSCULAR | Status: AC
  Filled 2013-06-27: qty 2

## 2013-06-27 MED ORDER — LIDOCAINE HCL (CARDIAC) 20 MG/ML IV SOLN
INTRAVENOUS | Status: AC
  Filled 2013-06-27: qty 5

## 2013-06-27 MED ORDER — ROCURONIUM BROMIDE 100 MG/10ML IV SOLN
INTRAVENOUS | Status: AC
  Filled 2013-06-27: qty 1

## 2013-06-27 MED ORDER — FENTANYL CITRATE 0.05 MG/ML IJ SOLN
INTRAMUSCULAR | Status: AC
  Filled 2013-06-27: qty 5

## 2013-06-27 MED ORDER — BUPIVACAINE HCL (PF) 0.25 % IJ SOLN
INTRAMUSCULAR | Status: AC
  Filled 2013-06-27: qty 30

## 2013-06-27 MED ORDER — LIDOCAINE HCL (CARDIAC) 20 MG/ML IV SOLN
INTRAVENOUS | Status: DC | PRN
  Administered 2013-06-27: 100 mg via INTRAVENOUS

## 2013-06-27 MED ORDER — DEXTROSE 5 % IV SOLN
3.0000 g | INTRAVENOUS | Status: AC
  Administered 2013-06-27: 3 g via INTRAVENOUS
  Filled 2013-06-27 (×2): qty 3000

## 2013-06-27 MED ORDER — LACTATED RINGERS IV SOLN
INTRAVENOUS | Status: DC
  Administered 2013-06-27: 13:00:00 via INTRAVENOUS

## 2013-06-27 MED ORDER — DEXAMETHASONE SODIUM PHOSPHATE 10 MG/ML IJ SOLN
INTRAMUSCULAR | Status: AC
  Filled 2013-06-27: qty 3

## 2013-06-27 MED ORDER — FENTANYL CITRATE 0.05 MG/ML IJ SOLN
INTRAMUSCULAR | Status: DC | PRN
  Administered 2013-06-27 (×5): 50 ug via INTRAVENOUS
  Administered 2013-06-27: 100 ug via INTRAVENOUS

## 2013-06-27 MED ORDER — ONDANSETRON HCL 4 MG/2ML IJ SOLN
INTRAMUSCULAR | Status: DC | PRN
  Administered 2013-06-27: 4 mg via INTRAVENOUS

## 2013-06-27 SURGICAL SUPPLY — 37 items
ATTRACTOMAT 16X20 MAGNETIC DRP (DRAPES) ×2 IMPLANT
BENZOIN TINCTURE PRP APPL 2/3 (GAUZE/BANDAGES/DRESSINGS) ×2 IMPLANT
BLADE HEX COATED 2.75 (ELECTRODE) ×2 IMPLANT
BLADE SURG 15 STRL LF DISP TIS (BLADE) ×1 IMPLANT
BLADE SURG 15 STRL SS (BLADE) ×1
CANISTER SUCTION 2500CC (MISCELLANEOUS) IMPLANT
CHLORAPREP W/TINT 10.5 ML (MISCELLANEOUS) ×2 IMPLANT
CLIP TI MEDIUM 6 (CLIP) ×6 IMPLANT
CLIP TI WIDE RED SMALL 6 (CLIP) ×4 IMPLANT
DRAPE PED LAPAROTOMY (DRAPES) ×2 IMPLANT
DRESSING SURGICEL FIBRLLR 1X2 (HEMOSTASIS) ×1 IMPLANT
DRSG SURGICEL FIBRILLAR 1X2 (HEMOSTASIS) ×2
ELECT REM PT RETURN 9FT ADLT (ELECTROSURGICAL) ×2
ELECTRODE REM PT RTRN 9FT ADLT (ELECTROSURGICAL) ×1 IMPLANT
GAUZE SPONGE 2X2 8PLY STRL LF (GAUZE/BANDAGES/DRESSINGS) ×1 IMPLANT
GAUZE SPONGE 4X4 16PLY XRAY LF (GAUZE/BANDAGES/DRESSINGS) ×2 IMPLANT
GLOVE SURG ORTHO 8.0 STRL STRW (GLOVE) ×2 IMPLANT
GOWN STRL REIN XL XLG (GOWN DISPOSABLE) ×6 IMPLANT
KIT BASIN OR (CUSTOM PROCEDURE TRAY) ×2 IMPLANT
NEEDLE HYPO 25X1 1.5 SAFETY (NEEDLE) ×2 IMPLANT
NS IRRIG 1000ML POUR BTL (IV SOLUTION) ×2 IMPLANT
PACK BASIC VI WITH GOWN DISP (CUSTOM PROCEDURE TRAY) ×2 IMPLANT
PENCIL BUTTON HOLSTER BLD 10FT (ELECTRODE) ×2 IMPLANT
SPONGE GAUZE 2X2 STER 10/PKG (GAUZE/BANDAGES/DRESSINGS) ×1
STAPLER VISISTAT 35W (STAPLE) IMPLANT
STRIP CLOSURE SKIN 1/2X4 (GAUZE/BANDAGES/DRESSINGS) ×2 IMPLANT
SUT MNCRL AB 4-0 PS2 18 (SUTURE) ×2 IMPLANT
SUT SILK 2 0 (SUTURE) ×1
SUT SILK 2-0 18XBRD TIE 12 (SUTURE) ×1 IMPLANT
SUT SILK 3 0 (SUTURE) ×1
SUT SILK 3-0 18XBRD TIE 12 (SUTURE) ×1 IMPLANT
SUT VIC AB 3-0 SH 18 (SUTURE) ×2 IMPLANT
SYR BULB IRRIGATION 50ML (SYRINGE) ×2 IMPLANT
SYR CONTROL 10ML LL (SYRINGE) ×2 IMPLANT
TOWEL OR 17X26 10 PK STRL BLUE (TOWEL DISPOSABLE) ×2 IMPLANT
TOWEL OR NON WOVEN STRL DISP B (DISPOSABLE) ×2 IMPLANT
YANKAUER SUCT BULB TIP 10FT TU (MISCELLANEOUS) ×2 IMPLANT

## 2013-06-27 NOTE — Telephone Encounter (Signed)
Message copied by Joanette Gula on Thu Jun 27, 2013 12:36 PM ------      Message from: Velora Heckler      Created: Thu Jun 27, 2013 11:44 AM       Gerri Spore  12/18            Dx: primary hyperparathyroidism            Proc: parathyroidectomy            See in office 2-3 weeks.  Get calcium level before OV.            tmg            Velora Heckler, MD, [facility]      Office: [phone]             ------

## 2013-06-27 NOTE — Transfer of Care (Signed)
Immediate Anesthesia Transfer of Care Note  Patient: Steven Lee  Procedure(s) Performed: Procedure(s) (LRB):  LEFT PARATHYROIDECTOMY  (N/A)  Patient Location: PACU  Anesthesia Type: General  Level of Consciousness: sedated, patient cooperative and responds to stimulation  Airway & Oxygen Therapy: Patient Spontanous Breathing and Patient connected to face mask oxgen  Post-op Assessment: Report given to PACU RN and Post -op Vital signs reviewed and stable  Post vital signs: Reviewed and stable  Complications: No apparent anesthesia complications

## 2013-06-27 NOTE — Progress Notes (Signed)
Patient has a prescription for home for Percocet. Patient stated he is not to take Tylenol right now due to high liver enzymes. Dr. Gerrit Friends paged and he is having the PA come over with a different prescription for patient to have.

## 2013-06-27 NOTE — Op Note (Signed)
OPERATIVE REPORT - PARATHYROIDECTOMY  Preoperative diagnosis: Primary hyperparathyroidism  Postop diagnosis: Same  Procedure: Left minimally invasive parathyroidectomy  Surgeon:  Velora Heckler, MD, FACS  Anesthesia: Gen. endotracheal  Estimated blood loss: Minimal  Preparation: ChloraPrep  Indications: Patient is a 32 year old male referred by his primary care physician for evaluation of hypercalcemia and suspected primary hyperparathyroidism. Patient was found on routine screening lab work to have an elevated serum calcium level of 10.9. Subsequent intact PTH level was found to be elevated at 84. 24-hour urine collection for calcium was elevated greater than 400. Nuclear medicine parathyroid scan localized an adenoma to the left inferior position.  Procedure: Patient was prepared in the holding area. He was brought to operating room and placed in a supine position on the operating room table. Following administration of general anesthesia, the patient was positioned and then prepped and draped in the usual strict aseptic fashion. After ascertaining that an adequate level of anesthesia been achieved, a neck incision was made with a #15 blade. Dissection was carried through subcutaneous tissues and platysma. Hemostasis was obtained with the electrocautery. Skin flaps were developed circumferentially and a Weitlander retractor was placed for exposure.  Strap muscles were incised in the midline. Strap muscles were reflected exposing the thyroid lobe. With gentle blunt dissection the thyroid lobe was mobilized.  Dissection was carried through adipose tissue and an enlarged parathyroid gland was identified. It was gently mobilized. The gland was relatively firm and appeared somewhat inflammatory. Vascular structures were divided between small and medium ligaclips. Care was taken to avoid the recurrent laryngeal nerve and the esophagus. The parathyroid gland was completely excised. It was submitted to  pathology (Dr. Jimmy Picket) where frozen section confirmed parathyroid tissue consistent with adenoma.  Neck was irrigated with warm saline and good hemostasis was noted. Fibrillar was placed in the operative field. Strap muscles were reapproximated in the midline with interrupted 3-0 Vicryl sutures. Platysma was closed with interrupted 3-0 Vicryl sutures. Skin was closed with a running 4-0 Monocryl subcuticular suture. Marcaine was infiltrated circumferentially. Wound was washed and dried and benzoin and Steri-Strips were applied. Sterile gauze dressings were applied. Patient was awakened from anesthesia and brought to the recovery room. The patient tolerated the procedure well.   Velora Heckler, MD, FACS General & Endocrine Surgery [facility]

## 2013-06-27 NOTE — Preoperative (Signed)
Beta Blockers   Reason not to administer Beta Blockers:Not Applicable 

## 2013-06-27 NOTE — H&P (View-Only) (Signed)
General Surgery - [facility]  Chief Complaint  Patient presents with  . Follow-up    primary hyperparathyroidism    HISTORY: The patient is a 32-year-old male with primary hyperparathyroidism. Nuclear medicine parathyroid scan localizes a parathyroid adenoma to the left inferior position. Patient had been scheduled for surgery. His procedure was canceled due to elevation in his liver enzymes. He has undergone a workup by his gastroenterologist including liver biopsy. 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 He has early changes of fibrosis. He has been cleared by his gastroenterologist to proceed with parathyroid surgery.  EXAM: Anterior examination of the neck shows it to be symmetric. Palpation shows no palpable mass. No lymphadenopathy. No tenderness.  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 He and I reviewed the operation. We discussed his operative course and recovery. We discussed minimally invasive surgery and the possibility of a second gland adenoma at some point in the future. He understands and agrees to proceed.  The risks and benefits of the procedure have been discussed at length with the patient.  The patient understands the proposed procedure, potential alternative treatments, and the course of recovery to be expected.  All of the patient's questions have been answered at this time.  The patient wishes to proceed with surgery.  [name] M. [name], MD, FACS General & Endocrine Surgery [facility]   Visit Diagnoses: 1. Hyperparathyroidism, primary       

## 2013-06-27 NOTE — Anesthesia Preprocedure Evaluation (Signed)
Anesthesia Evaluation  Patient identified by MRN, date of birth, ID band Patient awake    Reviewed: Allergy & Precautions, H&P , NPO status , Patient's Chart, lab work & pertinent test results  Airway Mallampati: III TM Distance: >3 FB Neck ROM: full    Dental no notable dental hx. (+) Teeth Intact and Dental Advisory Given   Pulmonary sleep apnea and Continuous Positive Airway Pressure Ventilation ,  breath sounds clear to auscultation  Pulmonary exam normal       Cardiovascular Exercise Tolerance: Good negative cardio ROS  Rhythm:regular Rate:Normal     Neuro/Psych negative neurological ROS  negative psych ROS   GI/Hepatic negative GI ROS, Neg liver ROS, GERD-  Medicated and Controlled,Liver enzymes elevated slightly   Endo/Other  negative endocrine ROS  Renal/GU negative Renal ROS  negative genitourinary   Musculoskeletal   Abdominal   Peds  Hematology negative hematology ROS (+)   Anesthesia Other Findings   Reproductive/Obstetrics negative OB ROS                           Anesthesia Physical Anesthesia Plan  ASA: III  Anesthesia Plan: General   Post-op Pain Management:    Induction: Intravenous  Airway Management Planned: Oral ETT  Additional Equipment:   Intra-op Plan:   Post-operative Plan: Extubation in OR  Informed Consent: I have reviewed the patients History and Physical, chart, labs and discussed the procedure including the risks, benefits and alternatives for the proposed anesthesia with the patient or authorized representative who has indicated his/her understanding and acceptance.   Dental Advisory Given  Plan Discussed with: CRNA and Surgeon  Anesthesia Plan Comments:         Anesthesia Quick Evaluation

## 2013-06-27 NOTE — Interval H&P Note (Signed)
History and Physical Interval Note:  06/27/2013 9:41 AM  Steven Lee  has presented today for surgery, with the diagnosis of primary hyperparathyroidism.  The various methods of treatment have been discussed with the patient and family. After consideration of risks, benefits and other options for treatment, the patient has consented to    Procedure(s): PARATHYROIDECTOMY (N/A) as a surgical intervention .    The patient's history has been reviewed, patient examined, no change in status, stable for surgery.  I have reviewed the patient's chart and labs.  Questions were answered to the patient's satisfaction.    Velora Heckler, MD, [facility] Office: [phone]   Sidonia Nutter Judie Petit

## 2013-06-27 NOTE — Anesthesia Postprocedure Evaluation (Signed)
  Anesthesia Post-op Note  Patient: Steven Lee  Procedure(s) Performed: Procedure(s) (LRB):  LEFT PARATHYROIDECTOMY  (N/A)  Patient Location: PACU  Anesthesia Type: General  Level of Consciousness: awake and alert   Airway and Oxygen Therapy: Patient Spontanous Breathing  Post-op Pain: mild  Post-op Assessment: Post-op Vital signs reviewed, Patient's Cardiovascular Status Stable, Respiratory Function Stable, Patent Airway and No signs of Nausea or vomiting  Last Vitals:  Filed Vitals:   06/27/13 1215  BP: 128/75  Pulse: 54  Temp:   Resp: 12    Post-op Vital Signs: stable   Complications: No apparent anesthesia complications

## 2013-06-27 NOTE — Telephone Encounter (Signed)
Calcium lab ordered and lab slip mailed to pt.

## 2013-06-27 NOTE — Progress Notes (Signed)
Dr. Leta Jungling made aware of patient's length of stay in PACU thus far- O.K. To go  To Short Stay now.

## 2013-06-27 NOTE — Progress Notes (Signed)
The surgery PA came and wrote patient a script for Oxycodone. She took [employer] to destroy. Patient home with family at 52.

## 2013-06-28 ENCOUNTER — Encounter (INDEPENDENT_AMBULATORY_CARE_PROVIDER_SITE_OTHER): Payer: Self-pay

## 2013-06-28 ENCOUNTER — Telehealth (INDEPENDENT_AMBULATORY_CARE_PROVIDER_SITE_OTHER): Payer: Self-pay

## 2013-06-28 ENCOUNTER — Encounter (HOSPITAL_COMMUNITY): Payer: Self-pay | Admitting: Surgery

## 2013-06-28 NOTE — Telephone Encounter (Signed)
Called pt. Pt home doing well. Pt request early am appt due to school. Pt will come in at 8:45 07-15-13. Pt aware to get labs prior to appt on 1-2. Lab slip mailed.

## 2013-07-12 LAB — CALCIUM: Calcium: 9.6 mg/dL (ref 8.4–10.5)

## 2013-07-15 ENCOUNTER — Ambulatory Visit (INDEPENDENT_AMBULATORY_CARE_PROVIDER_SITE_OTHER): Payer: BC Managed Care – PPO | Admitting: Surgery

## 2013-07-15 ENCOUNTER — Encounter (INDEPENDENT_AMBULATORY_CARE_PROVIDER_SITE_OTHER): Payer: Self-pay | Admitting: Surgery

## 2013-07-15 VITALS — BP 130/84 | HR 76 | Temp 98.2°F | Resp 14 | Ht 70.0 in | Wt 279.0 lb

## 2013-07-15 DIAGNOSIS — E213 Hyperparathyroidism, unspecified: Secondary | ICD-10-CM

## 2013-07-15 DIAGNOSIS — E210 Primary hyperparathyroidism: Secondary | ICD-10-CM

## 2013-07-15 NOTE — Patient Instructions (Signed)
  COCOA BUTTER & VITAMIN E CREAM  ([employer] or other brand)  Apply cocoa butter/vitamin E cream to your incision 2 - 3 times daily.  Massage cream into incision for one minute with each application.  Use sunscreen (50 SPF or higher) for first 6 months after surgery if area is exposed to sun.  You may substitute Mederma or other scar reducing creams as desired.   

## 2013-07-15 NOTE — Progress Notes (Signed)
General Surgery [facility]  Chief Complaint  Patient presents with  . Routine Post Op    parathyroidectomy 06/27/2013    HISTORY: Patient is a 33 year old male who underwent left inferior minimally invasive parathyroidectomy on 06/27/2013. Final pathology shows parathyroid adenoma weighing 2.5 g. Calcium level is now normal at 9.6. He returns today for wound check.  EXAM: Incision is healed nicely. Mild soft tissue swelling. No sign of seroma. No sign of infection. Voice quality is normal.  IMPRESSION: Status post minimally invasive parathyroidectomy with normalization of serum calcium level  PLAN: Patient will begin applying topical creams to his incisions. He will return for wound check in 6 weeks. We will check a calcium level in intact PTH level prior to that office visit.  Earnstine Regal, MD, [facility]   Visit Diagnoses: 1. Hyperparathyroidism   2. Hyperparathyroidism, primary

## 2013-07-22 ENCOUNTER — Encounter (INDEPENDENT_AMBULATORY_CARE_PROVIDER_SITE_OTHER): Payer: BC Managed Care – PPO | Admitting: Surgery

## 2013-08-27 ENCOUNTER — Encounter (INDEPENDENT_AMBULATORY_CARE_PROVIDER_SITE_OTHER): Payer: BC Managed Care – PPO | Admitting: Surgery

## 2013-08-28 ENCOUNTER — Encounter (INDEPENDENT_AMBULATORY_CARE_PROVIDER_SITE_OTHER): Payer: BC Managed Care – PPO | Admitting: Surgery

## 2013-09-24 ENCOUNTER — Encounter (INDEPENDENT_AMBULATORY_CARE_PROVIDER_SITE_OTHER): Payer: Self-pay | Admitting: Surgery

## 2013-09-24 ENCOUNTER — Ambulatory Visit (INDEPENDENT_AMBULATORY_CARE_PROVIDER_SITE_OTHER): Payer: BC Managed Care – PPO | Admitting: Surgery

## 2013-09-24 VITALS — BP 138/88 | HR 76 | Temp 98.0°F | Resp 16 | Ht 70.0 in | Wt 286.4 lb

## 2013-09-24 DIAGNOSIS — E210 Primary hyperparathyroidism: Secondary | ICD-10-CM

## 2013-09-24 LAB — PTH, INTACT AND CALCIUM
CALCIUM: 10 mg/dL (ref 8.4–10.5)
PTH: 72.1 pg/mL — ABNORMAL HIGH (ref 14.0–72.0)

## 2013-09-24 NOTE — Patient Instructions (Addendum)
Take Vitamin D 1000 - 2000 IU daily.  Earnstine Regal, MD, [facility] Office: [phone]  Parathyroid Hormone This is a test to determine whether PTH levels are responding normally to changes in blood calcium levels. It also helps to distinguish the cause of calcium imbalances, and to evaluate parathyroid function. When calcium blood levels are higher or lower than normal, and when your caregiver may want to determine how well your parathyroid glands are working. Parathyroid hormone (PTH) helps the body maintain stable levels of calcium in the blood. It is part of a 'feedback loop' that includes calcium, PTH, vitamin D, and, to some extent, phosphate and magnesium. Conditions and diseases that disrupt this feedback loop can cause inappropriate elevations or decreases in calcium and PTH levels and lead to symptoms of hypercalcemia (raised blood levels of calcium) or hypocalcemia (low blood levels of calcium).  PTH is produced by four parathyroid glands that are located in the neck beside the thyroid gland. Normally, these glands secrete PTH into the bloodstream in response to low blood calcium levels. Parathyroid hormone then works in three ways to help raise blood calcium levels back to normal. It takes calcium from the body's bone, stimulates the activation of vitamin D in the kidney (which in turn increases the absorption of calcium from the intestines), and suppresses the excretion of calcium in the urine (while encouraging excretion of phosphate). As calcium levels begin to increase in the blood, PTH normally decreases. PREPARATION FOR TEST You should have nothing to eat or drink except for water after midnight on the day of the test or as directed by your caregiver. A blood sample is obtained by inserting a needle into a vein in the arm. NORMAL FINDINGS Conventional Normal  PTH intact (whole)  Assay includes intact PTH  Values (pg/mL)10-65  SI Units  (ng/L)10-65  PTH N-terminalN-terminal  Values (pg/mL) 8-24  SI Units (ng/L)8-24  PTH C-terminal  Assay Includes C-terminal  Values (pg/mL) 50-330  SI Units (ng/L) 50-330  Intact PTH  Midmolecule Ranges for normal findings may vary among different laboratories and hospitals. You should always check with your doctor after having lab work or other tests done to discuss the meaning of your test results and whether your values are considered within normal limits. MEANING OF TEST  Your caregiver will go over the test results with you and discuss the importance and meaning of your results, as well as treatment options and the need for additional tests if necessary. OBTAINING THE TEST RESULTS  It is your responsibility to obtain your test results. Ask the lab or department performing the test when and how you will get your results. Document Released: 07/30/2004 Document Revised: 09/19/2011 Document Reviewed: 06/08/2008 [facility] Patient Information 2014 [place], [place].

## 2013-09-24 NOTE — Progress Notes (Signed)
General Surgery [facility]  Chief Complaint  Patient presents with  . Routine Post Op    parathyroidectomy 06/27/2014    HISTORY: The patient is a 33 year old male who underwent minimally invasive parathyroidectomy in December 2015 for a large 2.5 g parathyroid adenoma. Postoperatively his serum calcium levels have remained normal. Most recent calcium level is 10.0 and his intact PTH level is 72.1, down from a preoperative level of 84.  EXAM: Surgical incision is well-healed. No sign of infection. Voice quality is normal.  IMPRESSION: Status post minimally invasive parathyroidectomy  PLAN: I have encouraged the patient to take a daily vitamin D supplement. This may be part of the cause of his PTH level running at the upper end of normal. I would ask his primary care physician to check a 25 hydroxy vitamin D level the next time he does routine laboratory studies. I would not follow the PTH level. I would continue to follow the serum calcium level.  Patient will return for surgical care as needed.  Earnstine Regal, MD, [facility]   Visit Diagnoses: 1. Hyperparathyroidism, primary

## 2013-11-13 ENCOUNTER — Ambulatory Visit: Payer: BC Managed Care – PPO | Admitting: Neurology

## 2014-05-28 ENCOUNTER — Ambulatory Visit: Payer: Self-pay | Admitting: Neurology

## 2014-07-21 ENCOUNTER — Encounter: Payer: Self-pay | Admitting: Neurology

## 2014-07-21 ENCOUNTER — Ambulatory Visit (INDEPENDENT_AMBULATORY_CARE_PROVIDER_SITE_OTHER): Payer: BC Managed Care – PPO | Admitting: Neurology

## 2014-07-21 DIAGNOSIS — G4710 Hypersomnia, unspecified: Secondary | ICD-10-CM

## 2014-07-21 DIAGNOSIS — G4733 Obstructive sleep apnea (adult) (pediatric): Secondary | ICD-10-CM

## 2014-07-21 DIAGNOSIS — Z9989 Dependence on other enabling machines and devices: Secondary | ICD-10-CM

## 2014-07-21 MED ORDER — MODAFINIL 100 MG PO TABS: 100.0000 mg | ORAL_TABLET | Freq: Every day | ORAL | Status: DC

## 2014-07-21 NOTE — Progress Notes (Addendum)
Chief Complaint  Patient presents with  . RV cpap    Rm 10, alone    [facility] Resolution of obstructive sleep apnea at a CPAP setting of 11 cm water. The patient is highly motivated his compliance rate is 100% his sleepiness score has declined to 7 points. I would like for Mr. Steven Lee to continue the  CPAP use and pursue at the same time programs for weight loss, as this is his main risk for the sleep apnea.  I would like to add that the patient has a strong family history of sleep apnea in parents and siblings.  [facility] , CPAP compliance.   Provider:  Dr Brett Fairy Referring Provider: Velna Hatchet, MD Primary Care Physician:  Velna Hatchet, MD  Chief Complaint  Patient presents with  . RV cpap    Rm 10, alone    HPI:  LATRAVIS GRINE is a 34 y.o. male here as a referral from [facility].   This 34 year old Caucasian right-handed male was originally referred for a sleep study on 06/05/2012. The patient had  obesity, GERD, plantar fasciitis. He also reported having been diagnosed with obstructive sleep apnea and had been using a CPAP machine that he acquired from a family member. He now needed a formal sleep evaluation to have the machine set to his knee. He presented with the complaints of excessive daytime sleepiness, snoring and witnessed apneas. He endorsed the sleepiness score at 14 points the backs score at 7 pints.  The sleep study showed an AHI also known as apnea hypotony index of 97 and an RDI even higher.  the patient's lowest oxygen level was 71%. The average heart rate was 53 beats per minute the patient was titrated to CPAP and responded well to a pressure of 10 cm water. The patient slept mostly in supine position and during the titration only in supine position. I ordered the CPAP to be set at 11 cm water was a 2 cm EPI. I obtained a detailed download from his machine through [facility] , which shows an average AHI of 2.9 at a  CPAP pressure of 11 cm, and average usage of the machine at night for 7 hours 53 minutes the 30 day download shows 100% compliance. Epworth was 7 Points oin 2014 .    07-21-14 'interval history: CD   The patient has been able to use his CPAP every night and seems to have no trouble with a technical for comfort level provided by CPAP his Epworth sleepiness score today was slightly elevated at 13 points his fatigue severity at 37 points his CPAP machine leave some residual AHI of 3.2 which is a desirable degree at a pressure of 11 cm water at 30 day download was obtained here in the office today and the tracings reviewed the download is dated 07-20-14 referring to the last night of use. Average user time is 7 hours and 33 minutes the patient has a compliance of 100% 4 days and 93.3% compliance for use over 4 hours consecutively. Based on this there is no issue with compliance at all he seems to have use the humidifier as needed setting, and  is currently on level  5 . CPAP  started with a ramp pressure of 4 cm water over a period of 10 minutes- until reaching the set pressure of 11 cm water. He also has a 2 cm flex function.   The patient, who is a Pharmacist, hospital, reports that during some a month  and being not at work his sleep habits change but he has nonetheless always used CPAP.  The patient endorses a bedtime between 10:30 and 11:30 the am and and has to get up at 5:30 AM.. His now wife  has reported less restless sleep as well as regional snoring and reduced apneas.  The wife has noticed how much less disturbed she is by his snoring and apnea and he himself states that he has noted the degree of restorative sleep he can only get been using CPAP.  He is taking multiple vitamins and Prilosec but is on no prescription medication.   On his review of systems he only endorsed some degree of residual daytime sleepiness which seems to be unrelated to apnea given the good resolution.   He is a nonsmoker and he  drinks occasional alcohol he does not use any recreational drugs. There is nothing else endorsed on his review of systems his current weight is 295 pounds at a height of 5 foot and 10 inches.    Review of Systems: Out of a complete 14 system review, the patient complains of only the following symptoms, and all other reviewed systems are negative. Sleepiness : Epworth 13 and FSS 37 .snoring and apnea resolved. Daytime naps; none.  Sleep is more  restorative.  Some nights with insomnia.      History   Social History  . Marital Status: Married    Spouse Name: Hilbert Corrigan    Number of Children: 0  . Years of Education: 16   Occupational History  . 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 Smoking status: Never Smoker   . Smokeless tobacco: Current User  . Alcohol Use: 1.0 - 1.5 oz/week    2-3 drink(s) per week     Comment: occassionally  . Drug Use: No  . Sexual Activity:    Partners: Female   Other Topics Concern  . Not on file   Social History Narrative   Lives with his wife.  Caffeine 3 cups daily.  BS Chartered loss adjuster.      Family History  Problem Relation Age of Onset  . Mental illness Sister     bipolar    Past Medical History  Diagnosis Date  . Allergy   . GERD (gastroesophageal reflux disease)   . Obesity   . Plantar fasciitis 06-24-13    rt. foot  . Elevated liver enzymes 06-24-13    noted-had liver bx. "told nonalcoholic fatty liver"  . OSA on CPAP     cpap used    Past Surgical History  Procedure Laterality Date  . Eye surgery      [place] bilaterally  . Parathyroidectomy N/A 06/27/2013    Procedure:  LEFT PARATHYROIDECTOMY ;  Surgeon: Earnstine Regal, MD;  Location: [facility]S;  Service: General;  Laterality: N/A;    Current Outpatient Prescriptions  Medication Sig Dispense Refill  . cholecalciferol (VITAMIN D) 1000 UNITS tablet Take 1,000 Units by mouth daily.    . Multiple Vitamin  (MULTIVITAMIN) tablet Take 1 tablet by mouth daily.    Marland Kitchen omeprazole (PRILOSEC) 20 MG capsule Take 20 mg by mouth daily.    . modafinil (PROVIGIL) 100 MG tablet Take 1 tablet (100 mg total) by mouth daily. 30 tablet 3   No current facility-administered medications for this visit.    Allergies as of 07/21/2014  . (No Known Allergies)    Vitals: BP 136/81  mmHg  Pulse 73  Resp 18  Ht '5\' 10"'  (1.778 m)  Wt 295 lb 12.8 oz (134.174 kg)  BMI 42.44 kg/m2 Last Weight:  Wt Readings from Last 1 Encounters:  07/21/14 295 lb 12.8 oz (134.174 kg)   Last Height:   Ht Readings from Last 1 Encounters:  07/21/14 '5\' 10"'  (1.778 m)   Vision Screening:  Vitals .  Physical exam:  General: The patient is awake, alert and appears not in acute distress. The patient is well groomed. Head: Normocephalic, atraumatic. Neck is supple. Mallampati 1- neck circumference:18 inches, TMJ positive , clicking in the right. No retrognathia.  Cardiovascular:  Regular rate and rhythm, without  murmurs or carotid bruit, and without distended neck veins. Respiratory: Lungs are clear to auscultation. Skin:  Without evidence of edema, or rash Trunk: BMI is elevated , patient  has normal posture.  Neurologic exam : The patient is awake and alert, oriented to place and time.  Memory subjective \ described as intact. There is a normal attention span & concentration ability. Speech is fluent without  dysarthria, dysphonia or aphasia. Mood and affect are appropriate.  Cranial nerves: Pupils are equal and briskly reactive to light. Funduscopic exam without  evidence of pallor or edema. Extraocular movements  in vertical and horizontal planes intact and without nystagmus. Visual fields by finger perimetry are intact. Hearing to finger rub intact.  Facial sensation intact to fine touch. Facial motor strength is symmetric and tongue and uvula move midline.  Motor exam:   Normal tone and normal muscle bulk and symmetric normal  strength in all extremities.  Sensory:  normal.  Coordination: Rapid alternating movements normal.   Gait and station: Patient walks without assistive device   Deep tendon reflexes: in the  upper and lower extremities are symmetric and intact.  Assessment:  After physical and neurologic examination, review of laboratory studies, imaging, neurophysiology testing and pre-existing records, assessment will be reviewed on the problem list.  Plan:  Treatment plan and additional workup :   The patient has OSA and is obese, his residual AHI on CPAP is low , and therefor apneas not likely the cause of his current fatigue and sleepiness. The apneas too well controlled. He does not report cataplexy, sleep paralysis, dream intrusion or very visit dreams. There are no detailed daytime sleep attacks he mostly feels that his fatigue and sleepiness level increases through the day and seems to peak after school hours. Little over a year ago this patient underwent a parathyroidectomy. This may  affect his  Fatigue - especially for calcium and vitamin D and he has been started on supplements.  For the last couple of months he had an increased degree of daytime sleepiness and fatigue but this would have been long after or several months after the parathyroidectomy was performed  . It may still contribute. I do not suspect narcolepsy based on the review of systems and would not order an HLA test.  He will try 100 mg Modafinil per morning Po. And if this gives him a gradual relief may increase to 266m daily. Rv in 12 month with NP.  What I would like for him is a retesting of vitamin D in the next 3-6 months to make sure that he is sufficiently supplemented. He will have a regular checkup with his endocrinologist in July.

## 2014-07-21 NOTE — Patient Instructions (Addendum)
Hypersomnia Hypersomnia usually brings recurrent episodes of excessive daytime sleepiness or prolonged nighttime sleep. It is different than feeling tired due to lack of or interrupted sleep at night. People with hypersomnia are compelled to nap repeatedly during the day. This is often at inappropriate times such as:  At work.  During a meal.  In conversation. These daytime naps usually provide no relief. This disorder typically affects adolescents and young adults. CAUSES  This condition may be caused by:  Another sleep disorder (such as narcolepsy or sleep apnea).  Dysfunction of the autonomic nervous system.  Drug or alcohol abuse.  A physical problem, such as:  A tumor.  Head trauma. This is damage caused by an accident.  Injury to the central nervous system.  Certain medications, or medicine withdrawal.  Medical conditions may contribute to the disorder, including:  Multiple sclerosis.  Depression.  Encephalitis.  Epilepsy.  Obesity.  Some people appear to have a genetic predisposition to this disorder. In others, there is no known cause. SYMPTOMS   Patients often have difficulty waking from a long sleep. They may feel dazed or confused.  Other symptoms may include:  Anxiety.  Increased irritation (inflammation).  Decreased energy.  Restlessness.  Slow thinking.  Slow speech.  Loss of appetite.  Hallucinations.  Memory difficulty.  Tremors, Tics.  Some patients lose the ability to function in family, social, occupational, or other settings. TREATMENT  Treatment is symptomatic in nature. Stimulants and other drugs may be used to treat this disorder. Changes in behavior may help. For example, avoid night work and social activities that delay bed time. Changes in diet may offer some relief. Patients should avoid alcohol and caffeine. PROGNOSIS  The likely outcome (prognosis) for persons with hypersomnia depends on the cause of the disorder.  The disorder itself is not life threatening. But it can have serious consequences. For example, automobile accidents can be caused by falling asleep while driving. The attacks usually continue indefinitely. Document Released: 06/17/2002 Document Revised: 09/19/2011 Document Reviewed: 05/21/2008 [facility] Patient Information 2015 [place], [place]. This information is not intended to replace advice given to you by your health care provider. Make sure you discuss any questions you have with your health care provider. Modafinil tablets What is this medicine? MODAFINIL (moe DAF i nil) is used to treat excessive sleepiness caused by certain sleep disorders. This includes narcolepsy, sleep apnea, and shift work sleep disorder. This medicine may be used for other purposes; ask your health care provider or pharmacist if you have questions. COMMON BRAND NAME(S): Provigil What should I tell my health care provider before I take this medicine? They need to know if you have any of these conditions: -history of depression, mania, or other mental disorder -kidney disease -liver disease -an unusual or allergic reaction to modafinil, other medicines, foods, dyes, or preservatives -pregnant or trying to get pregnant -breast-feeding How should I use this medicine? Take this medicine by mouth with a glass of water. Follow the directions on the prescription label. Take your doses at regular intervals. Do not take your medicine more often than directed. Do not stop taking except on your doctor's advice. A special MedGuide will be given to you by the pharmacist with each prescription and refill. Be sure to read this information carefully each time. Talk to your pediatrician regarding the use of this medicine in children. This medicine is not approved for use in children. Overdosage: If you think you have taken too much of this medicine contact a poison  control center or emergency room at once. NOTE: This medicine is  only for you. Do not share this medicine with others. What if I miss a dose? If you miss a dose, take it as soon as you can. If it is almost time for your next dose, take only that dose. Do not take double or extra doses. What may interact with this medicine? Do not take this medicine with any of the following medications: -amphetamine or dextroamphetamine -dexmethylphenidate or methylphenidate -medicines called MAO Inhibitors like Nardil, Parnate, Marplan, Eldepryl -pemoline -procarbazine This medicine may also interact with the following medications: -antifungal medicines like itraconazole or ketoconazole -barbiturates like phenobarbital -birth control pills or other hormone-containing birth control devices or implants -carbamazepine -cyclosporine -diazepam -medicines for depression, anxiety, or psychotic disturbances -phenytoin -propranolol -triazolam -warfarin This list may not describe all possible interactions. Give your health care provider a list of all the medicines, herbs, non-prescription drugs, or dietary supplements you use. Also tell them if you smoke, drink alcohol, or use illegal drugs. Some items may interact with your medicine. What should I watch for while using this medicine? Visit your doctor or health care professional for regular checks on your progress. The full effects of this medicine may not be seen right away. This medicine may affect your concentration, function, or may hide signs that you are tired. You may get dizzy. Do not drive, use machinery, or do anything that needs mental alertness until you know how this drug affects you. Alcohol can make you more dizzy and may interfere with your response to this medicine or your alertness. Avoid alcoholic drinks. Birth control pills may not work properly while you are taking this medicine. Talk to your doctor about using an extra method of birth control. It is unknown if the effects of this medicine will be increased  by the use of caffeine. Caffeine is available in many foods, beverages, and medications. Ask your doctor if you should limit or change your intake of caffeine-containing products while on this medicine. What side effects may I notice from receiving this medicine? Side effects that you should report to your doctor or health care professional as soon as possible: -allergic reactions like skin rash, itching or hives, swelling of the face, lips, or tongue -anxiety -breathing problems -chest pain -fast, irregular heartbeat -hallucinations -increased blood pressure -redness, blistering, peeling or loosening of the skin, including inside the mouth -sore throat, fever, or chills -suicidal thoughts or other mood changes -tremors -vomiting Side effects that usually do not require medical attention (report to your doctor or health care professional if they continue or are bothersome): -headache -nausea, diarrhea, or stomach upset -nervousness -trouble sleeping This list may not describe all possible side effects. Call your doctor for medical advice about side effects. You may report side effects to FDA at 1-800-FDA-1088. Where should I keep my medicine? Keep out of the reach of children. This medicine can be abused. Keep your medicine in a safe place to protect it from theft. Do not share this medicine with anyone. Selling or giving away this medicine is dangerous and against the law. Store at room temperature between 20 and 25 degrees C (68 and 77 degrees F). Throw away any unused medicine after the expiration date. NOTE: This sheet is a summary. It may not cover all possible information. If you have questions about this medicine, talk to your doctor, pharmacist, or health care provider.  2015, Elsevier/Gold Standard. (2009-06-09 21:07:42)

## 2014-07-21 NOTE — Addendum Note (Signed)
Addended by: Larey Seat on: 07/21/2014 04:12 PM   Modules accepted: Orders

## 2014-07-29 ENCOUNTER — Telehealth: Payer: Self-pay

## 2014-07-29 ENCOUNTER — Telehealth: Payer: Self-pay | Admitting: Neurology

## 2014-07-29 NOTE — Telephone Encounter (Signed)
Pt is calling stating he needs prior auth on modafinil (PROVIGIL) 100 MG tablet so it can be filled.  Please call and advise.

## 2014-07-29 NOTE — Telephone Encounter (Signed)
[place] has approve the request for coverage on Modafinil effective until 07/28/2015 Ref # [id number]

## 2014-07-29 NOTE — Telephone Encounter (Signed)
This request has already been approved.  I called back.  Got no answer.  Left message.

## 2014-11-20 ENCOUNTER — Ambulatory Visit: Payer: BC Managed Care – PPO | Admitting: Podiatry

## 2014-12-02 ENCOUNTER — Ambulatory Visit (INDEPENDENT_AMBULATORY_CARE_PROVIDER_SITE_OTHER): Payer: BC Managed Care – PPO

## 2014-12-02 ENCOUNTER — Other Ambulatory Visit: Payer: Self-pay | Admitting: Podiatry

## 2014-12-02 ENCOUNTER — Encounter: Payer: Self-pay | Admitting: Podiatry

## 2014-12-02 ENCOUNTER — Ambulatory Visit (INDEPENDENT_AMBULATORY_CARE_PROVIDER_SITE_OTHER): Payer: BC Managed Care – PPO | Admitting: Podiatry

## 2014-12-02 VITALS — Ht 70.0 in | Wt 285.0 lb

## 2014-12-02 DIAGNOSIS — M722 Plantar fascial fibromatosis: Secondary | ICD-10-CM | POA: Diagnosis not present

## 2014-12-02 NOTE — Patient Instructions (Signed)

## 2014-12-02 NOTE — Progress Notes (Signed)
   Subjective:    Patient ID: Steven Lee, male    DOB: 20-Apr-1981, 34 y.o.   MRN: [id number]  HPI    Review of Systems  All other systems reviewed and are negative.      Objective:   Physical Exam: I have reviewed his past medical history medications out a surgery social history and review of systems. Pulses are strongly palpable bilateral. Neurologic sensorium is intact per Semmes-Weinstein monofilament. Deep tendon reflexes are intact bilateral and muscle strength +5 over 5 dorsiflexors and plantar flexors and inverters everters onto the musculature is intact. Orthopedic evaluation and x-rays on palpation medial calcaneal tubercle of the right heel over the left.        Assessment & Plan:  Assessment: Plantar fasciitis nature bilateral.  Plan: Discussed etiology and pathology conservative versus surgical therapies. All her him an injection which he declined. Offered him sterilely as an nonsterile anti-inflammatory drugs for short period time he declined. At this point he would just like a new set of orthotics.

## 2014-12-24 ENCOUNTER — Ambulatory Visit: Payer: BC Managed Care – PPO | Admitting: *Deleted

## 2014-12-24 DIAGNOSIS — M722 Plantar fascial fibromatosis: Secondary | ICD-10-CM

## 2014-12-24 NOTE — Patient Instructions (Signed)

## 2014-12-24 NOTE — Progress Notes (Signed)
Patient ID: Steven Lee, male   DOB: 09-Sep-1980, 34 y.o.   MRN: [id number] PICKING UP INSERTS

## 2015-05-31 IMAGING — NM NM PARATHYROID W/ SPECT
4 series · 24 of 24 positions shown · non-contrast
Comparison: None.

CLINICAL DATA: The hyperparathyroidism.

EXAM:
NM PARATHYROID SCINTIGRAPHY AND SPECT IMAGING
TECHNIQUE: Following intravenous administration of radiopharmaceutical, early
and 2-hour delayed planar images were obtained in the anterior
projection. Delayed triplanar SPECT images were also obtained at 2
hours.

[Series 1: pa parathyroid · 4.75mm/px · 6 of 128 frames shown (1 of 4)]
[frame 11/128  full-range]
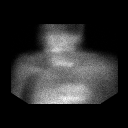
[frame 32/128  full-range]
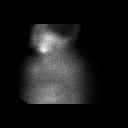
[frame 54/128  full-range]
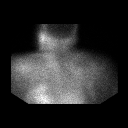
[frame 75/128  full-range]
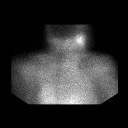
[frame 96/128  full-range]
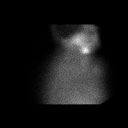
[frame 118/128  full-range]
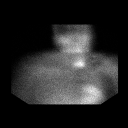

[Series 1: pa parathyroid · 4.7mm · 4.75mm/px · 6 of 91 frames shown (2 of 4)]
[frame 8/91]
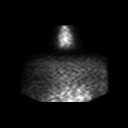
[frame 23/91]
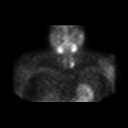
[frame 38/91]
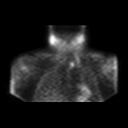
[frame 53/91]
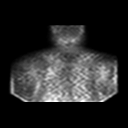
[frame 68/91]
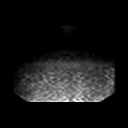
[frame 84/91]
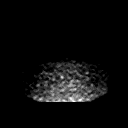

[Series 1: pa parathyroid · 4.7mm · 4.75mm/px · 6 of 91 frames shown (3 of 4)]
[frame 8/91]
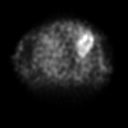
[frame 23/91]
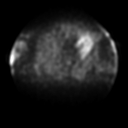
[frame 38/91]
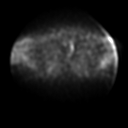
[frame 53/91]
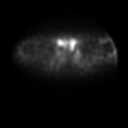
[frame 68/91]
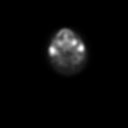
[frame 84/91]
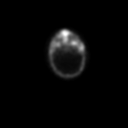

[Series 1: pa parathyroid · 4.7mm · 4.75mm/px · 6 of 91 frames shown (4 of 4)]
[frame 8/91]
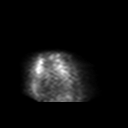
[frame 23/91]
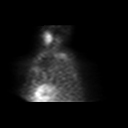
[frame 38/91]
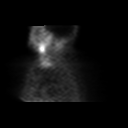
[frame 53/91]
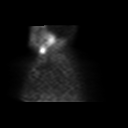
[frame 68/91]
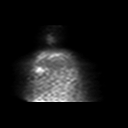
[frame 84/91]
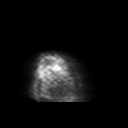

[24 of 24 positions shown; findings below may reference images not displayed]

RADIOPHARMACEUTICALS:  [id number] KOHVIK CARDIOLITE TECHNETIUM TC 99M
SESTAMIBI - [id number] Sestamibi IV
FINDINGS: There is persistent uptake noted in the region of the inferior left
thyroid lobe, most likely uptake within a parathyroid adenoma.
Normal washout noted elsewhere within the thyroid and salivary
glands.
IMPRESSION: Persistent abnormal uptake in the region of the inferior left
thyroid lobe.

## 2015-06-16 ENCOUNTER — Other Ambulatory Visit: Payer: Self-pay | Admitting: Neurology

## 2015-06-17 ENCOUNTER — Other Ambulatory Visit: Payer: Self-pay

## 2015-06-17 MED ORDER — MODAFINIL 100 MG PO TABS: 100.0000 mg | ORAL_TABLET | Freq: Every day | ORAL | Status: DC

## 2015-06-17 NOTE — Telephone Encounter (Signed)
Rx signed and faxed.

## 2015-06-18 ENCOUNTER — Other Ambulatory Visit: Payer: Self-pay | Admitting: Neurology

## 2015-07-23 ENCOUNTER — Ambulatory Visit (INDEPENDENT_AMBULATORY_CARE_PROVIDER_SITE_OTHER): Payer: BC Managed Care – PPO | Admitting: Adult Health

## 2015-07-23 ENCOUNTER — Encounter: Payer: Self-pay | Admitting: Adult Health

## 2015-07-23 VITALS — BP 149/88 | HR 90 | Ht 70.0 in | Wt 308.0 lb

## 2015-07-23 DIAGNOSIS — G4733 Obstructive sleep apnea (adult) (pediatric): Secondary | ICD-10-CM | POA: Diagnosis not present

## 2015-07-23 DIAGNOSIS — Z9989 Dependence on other enabling machines and devices: Principal | ICD-10-CM

## 2015-07-23 DIAGNOSIS — G4710 Hypersomnia, unspecified: Secondary | ICD-10-CM

## 2015-07-23 NOTE — Progress Notes (Signed)
I agree with the assessment and plan as directed by NP .The patient is known to me .   Horace Wishon, MD  

## 2015-07-23 NOTE — Patient Instructions (Signed)
Continue using CPAP nightly Continue with nuvigil as needed for sleepiness If your symptoms worsen or you develop new symptoms please let us know.

## 2015-07-23 NOTE — Progress Notes (Signed)
PATIENT: Steven Lee DOB: 04/30/1981  REASON FOR VISIT: follow up-  Obstructive sleep apnea on CPAP , daytime sleepiness HISTORY FROM: patient  HISTORY OF PRESENT ILLNESS: Steven Lee  Is a 35 year old male with a history of obstructive sleep apnea on CPAP as well as daytime sleepiness. He returns today for follow-up. The patient compliance download is excellent. Download today indicates that he uses his machine 30 out of 30 days for compliance of 100%. On average he uses his machine 7 hours and 33 minutes. He uses machine greater than 4 hours for compliance of 93.3%. His residual AHI is 3.2 on 11 cm of water. The patient reports that he has not gone a day without using his CPAP. He states that he finds it very beneficial. His Epworth sleepiness score is 5 and fatigue severity score is 38. He states that he does use the Nuvigil however he does not use it daily. He states that he typically uses it if he does not sleep well or has to go on long car  Drives. He denies any new neurological symptoms. He returns today for an evaluation.   HISTORY (DOHMEIER): Steven Lee is a 35 y.o. male here as a referral from [facility].   This 35 year old Caucasian right-handed male was originally referred for a sleep study on 06/05/2012. The patient had obesity, GERD, plantar fasciitis. He also reported having been diagnosed with obstructive sleep apnea and had been using a CPAP machine that he acquired from a family member. He now needed a formal sleep evaluation to have the machine set to his knee. He presented with the complaints of excessive daytime sleepiness, snoring and witnessed apneas. He endorsed the sleepiness score at 14 points the backs score at 7 pints.  The sleep study showed an AHI also known as apnea hypotony index of 97 and an RDI even higher. the patient's lowest oxygen level was 71%. The average heart rate was 53 beats per minute the patient was titrated to CPAP and responded  well to a pressure of 10 cm water. The patient slept mostly in supine position and during the titration only in supine position. I ordered the CPAP to be set at 11 cm water was a 2 cm EPI. I obtained a detailed download from his machine through [facility] , which shows an average AHI of 2.9 at a CPAP pressure of 11 cm, and average usage of the machine at night for 7 hours 53 minutes the 30 day download shows 100% compliance. Epworth was 7 Points oin 2014 .    07-21-14 'interval history: CD  The patient has been able to use his CPAP every night and seems to have no trouble with a technical for comfort level provided by CPAP his Epworth sleepiness score today was slightly elevated at 13 points his fatigue severity at 37 points his CPAP machine leave some residual AHI of 3.2 which is a desirable degree at a pressure of 11 cm water at 30 day download was obtained here in the office today and the tracings reviewed the download is dated 07-20-14 referring to the last night of use. Average user time is 7 hours and 33 minutes the patient has a compliance of 100% 4 days and 93.3% compliance for use over 4 hours consecutively. Based on this there is no issue with compliance at all he seems to have use the humidifier as needed setting, and is currently on level 5 . CPAP started with a ramp pressure of  4 cm water over a period of 10 minutes- until reaching the set pressure of 11 cm water. He also has a 2 cm flex function.  REVIEW OF SYSTEMS: Out of a complete 14 system review of symptoms, the patient complains only of the following symptoms, and all other reviewed systems are negative.   see history of present illness  ALLERGIES: No Known Allergies  HOME MEDICATIONS: Outpatient Prescriptions Prior to Visit  Medication Sig Dispense Refill  . cholecalciferol (VITAMIN D) 1000 UNITS tablet Take 1,000 Units by mouth daily.    . modafinil (PROVIGIL) 100 MG tablet TAKE 1 TABLET BY MOUTH EVERY DAY 30 tablet 3    . Multiple Vitamin (MULTIVITAMIN) tablet Take 1 tablet by mouth daily.    Marland Kitchen omeprazole (PRILOSEC) 20 MG capsule Take 20 mg by mouth daily.     No facility-administered medications prior to visit.    PAST MEDICAL HISTORY: Past Medical History  Diagnosis Date  . Allergy   . GERD (gastroesophageal reflux disease)   . Obesity   . Plantar fasciitis 06-24-13    rt. foot  . Elevated liver enzymes 06-24-13    noted-had liver bx. "told nonalcoholic fatty liver"  . OSA on CPAP     cpap used    PAST SURGICAL HISTORY: Past Surgical History  Procedure Laterality Date  . Eye surgery      [place] bilaterally  . Parathyroidectomy N/A 06/27/2013    Procedure:  LEFT PARATHYROIDECTOMY ;  Surgeon: Earnstine Regal, MD;  Location: [facility];  Service: General;  Laterality: N/A;    FAMILY HISTORY: Family History  Problem Relation Age of Onset  . Mental illness Sister     bipolar    SOCIAL HISTORY: Social History   Social History  . Marital Status: Married    Spouse Name: Hilbert Corrigan  . Number of Children: 0  . Years of Education: 16   Occupational History  . 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 Smoking status: Never Smoker   . Smokeless tobacco: Current User  . Alcohol Use: 1.0 - 1.5 oz/week    2-3 drink(s) per week     Comment: occassionally  . Drug Use: No  . Sexual Activity:    Partners: Female   Other Topics Concern  . Not on file   Social History Narrative   Lives with his wife.  Caffeine 3 cups daily.  BS Chartered loss adjuster.        PHYSICAL EXAM  Filed Vitals:   07/23/15 1532  BP: 149/88  Pulse: 90  Height: 5\' 10"  (1.778 m)  Weight: 308 lb (139.708 kg)   Body mass index is 44.19 kg/(m^2).  Generalized: Well developed, in no acute distress   neck:  Circumference 17-1/2 inches , Mallampati 3+  Neurological examination  Mentation: Alert oriented to time, place, history taking. Follows all commands  speech and language fluent Cranial nerve II-XII: Pupils were equal round reactive to light. Extraocular movements were full, visual field were full on confrontational test. Facial sensation and strength were normal. Uvula tongue midline. Head turning and shoulder shrug  were normal and symmetric. Motor: The motor testing reveals 5 over 5 strength of all 4 extremities. Good symmetric motor tone is noted throughout.  Sensory: Sensory testing is intact to soft touch on all 4 extremities. No evidence of extinction is noted.  Coordination: Cerebellar testing reveals good finger-nose-finger and heel-to-shin bilaterally.  Gait and station: Gait  is normal. Tandem gait is normal. Romberg is negative. No drift is seen.  Reflexes: Deep tendon reflexes are symmetric and normal bilaterally.   DIAGNOSTIC DATA (LABS, IMAGING, TESTING) - I reviewed patient records, labs, notes, testing and imaging myself where available.     ASSESSMENT AND PLAN 35 y.o. year old male  has a past medical history of Allergy; GERD (gastroesophageal reflux disease); Obesity; Plantar fasciitis (06-24-13); Elevated liver enzymes (06-24-13); and OSA on CPAP. here with:   1. Obstructive sleep apnea on CPAP  2. Persistent hypersomnia   The patient CPAP download is excellent. He should continue using the CPAP nightly. He can continue using the Nuvigil as needed. Patient advised that if his symptoms worsen or he develops any new symptoms he should let us know. He will follow-up in one year with Dr.Dohmeier.   Ward Givens, MSN, NP-C 07/23/2015, 3:35 PM [facility] [address], [place], [place] [address] [phone]

## 2015-07-23 NOTE — Progress Notes (Unsigned)
Pt is requesting that his DME be changed to [facility]. [facility] has not been providing supplies for pt because they are changing companies and insurances. Will fax referral to [facility].

## 2015-08-12 IMAGING — US US BIOPSY
1 series · 10 of 10 positions shown · non-contrast
Comparison: none

CLINICAL DATA: 32-year-old with fatty liver. Ultrasound-guided
random liver biopsy requested to evaluate for severity of disease,
and fibrosis/cirrhosis.

EXAM:
ULTRASOUND BIOPSY CORE LIVER
Date: 05/30/2013
TECHNIQUE: Informed consent was obtained from the patient following explanation
of the procedure, risks, benefits and alternatives. The patient
understands, agrees and consents for the procedure. All questions
were addressed. A time out was performed.

[Series 1: us biopsy · 0.15mm/px · 10 of 10 slices shown]
[im 1/10]
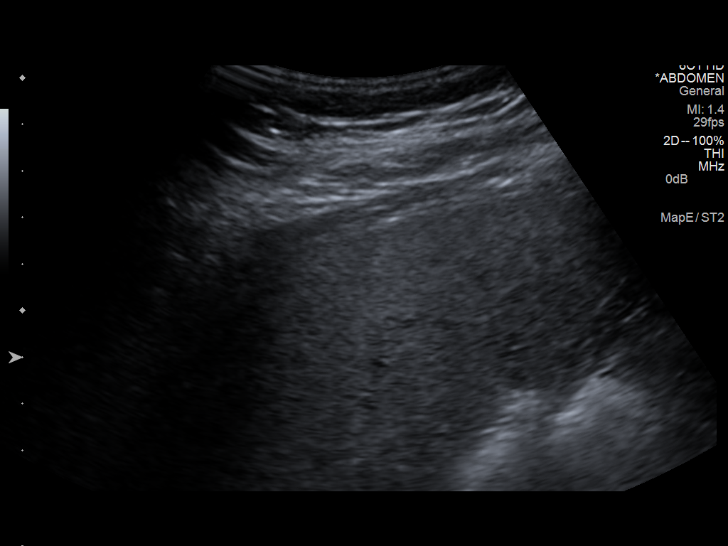
[im 2/10]
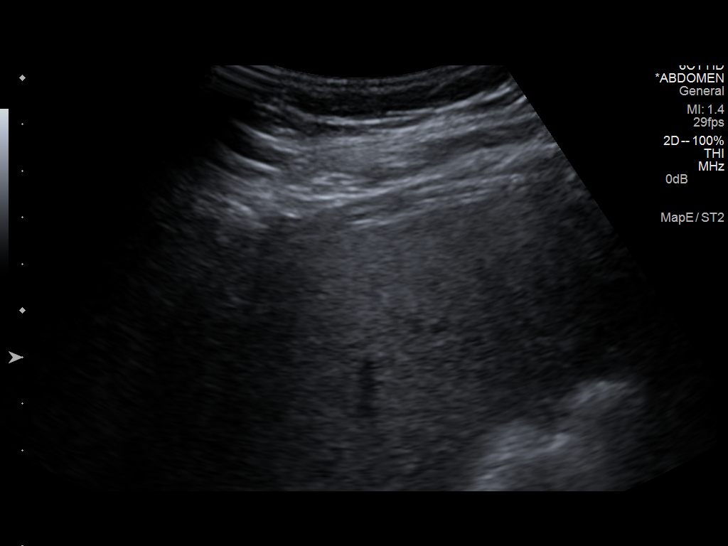
[im 3/10]
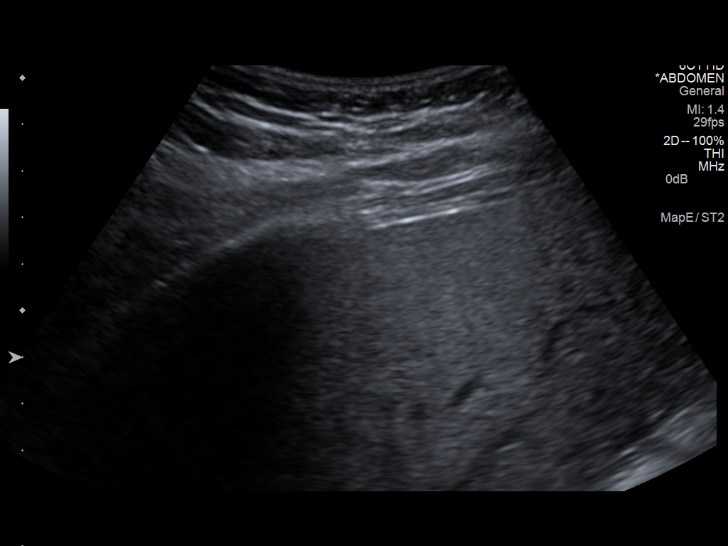
[im 4/10]
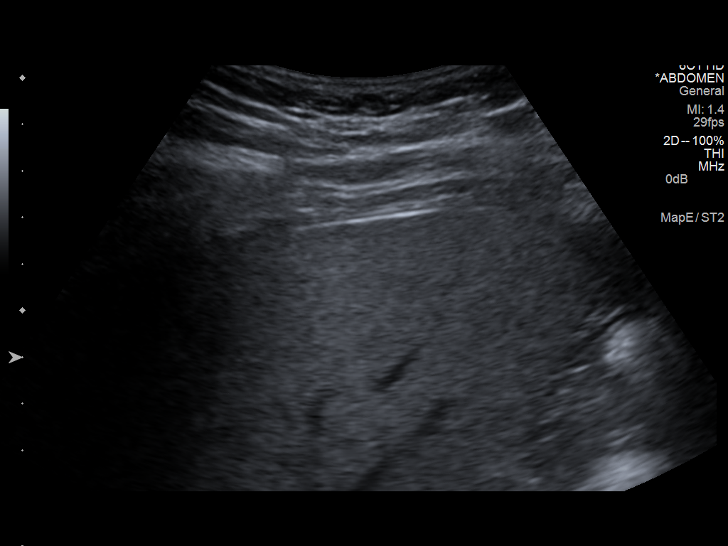
[im 5/10]
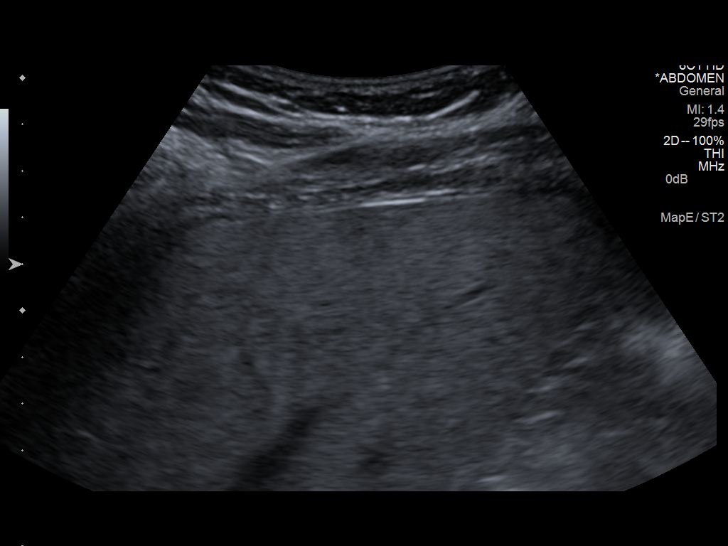
[im 6/10]
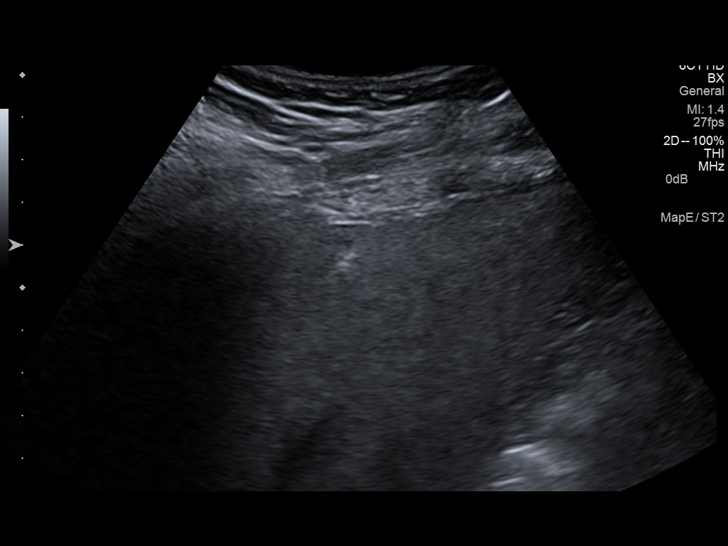
[im 7/10]
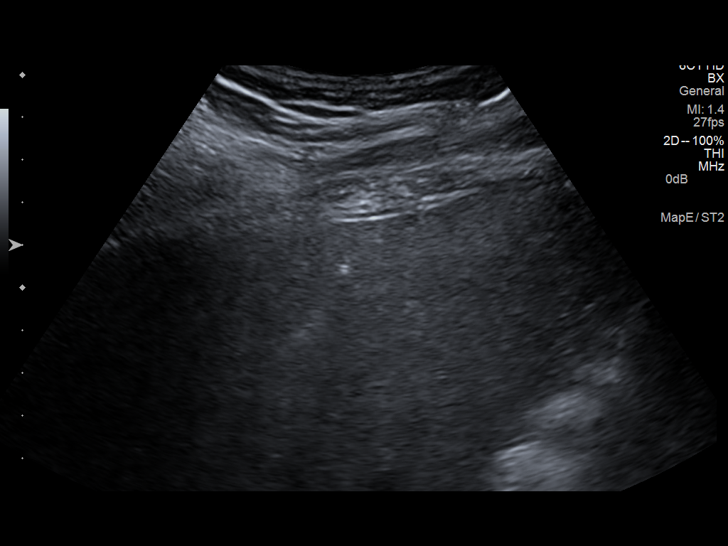
[im 8/10]
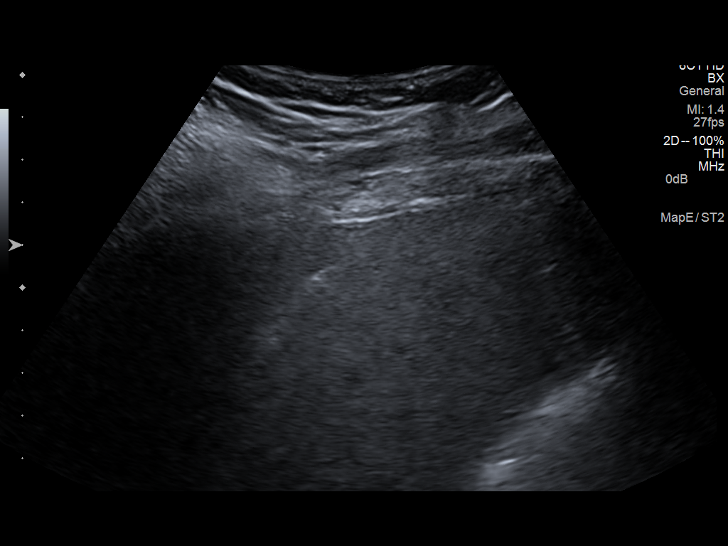
[im 9/10]
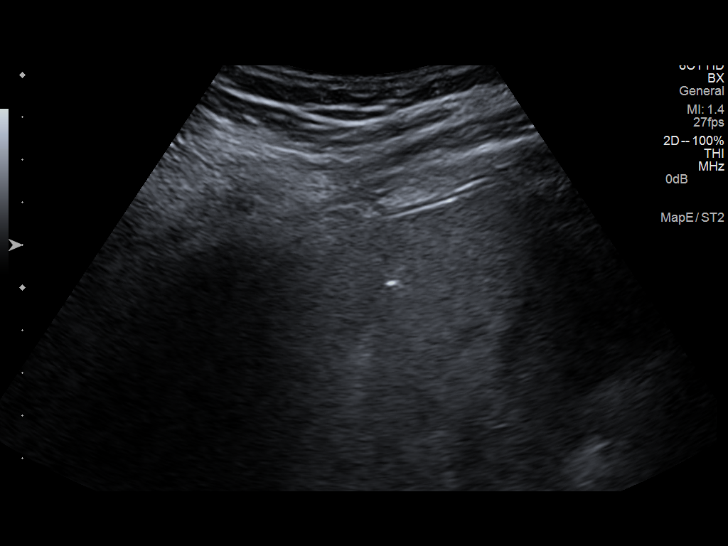
[im 10/10]
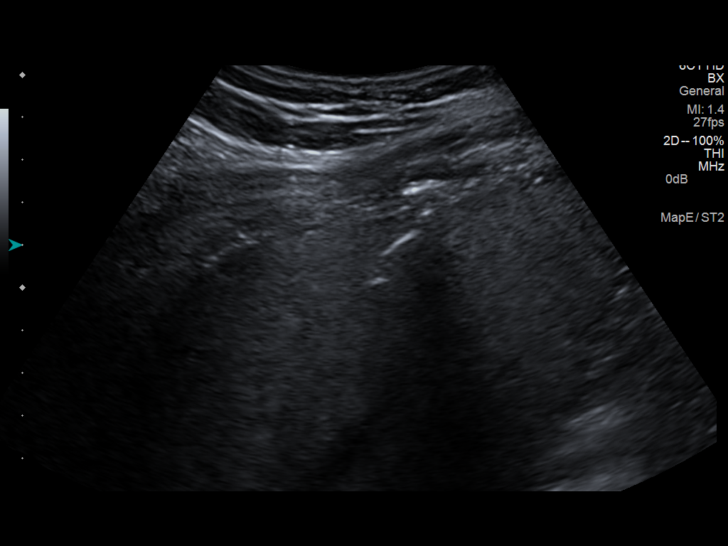

[10 of 10 positions shown; findings below may reference images not displayed]

Maximal barrier sterile technique utilized including caps, mask,
sterile gowns, sterile gloves, large sterile drape, hand hygiene,
and Betadine skin prep.

The right upper quadrant was interrogated with ultrasound. A
suitable relatively avascular plane of liver was identified. The
overlying skin was marked. Local anesthesia was attained by
infiltration with 1% lidocaine. A small dermatotomy was made with a
#11 blade. Under real-time sonographic guidance, a 17 gauge trocar
needle was carefully advanced through the right lateral abdominal
wall, over rib and into the margin of the liver. Multiple 18 gauge
core biopsies were then toe axially obtained using the Emun Furkan
automated biopsy device.

As the 17 gauge trocar needle was removed, the tract was embolized
with Gelfoam torpedoes. Post biopsy sonographic imaging demonstrates
no significant perihepatic hematoma or active hemorrhage. The
patient tolerated the procedure very well.

ANESTHESIA/SEDATION:
Moderate (conscious) sedation was used. Three mg Versed, 75 mcg
Fentanyl were administered intravenously. The patient's vital signs
were monitored continuously by radiology nursing throughout the
procedure.

Sedation Time: 15 minutes

PROCEDURE:
1. Ultrasound-guided core-biopsy

COMPLICATIONS:
None immediate.
IMPRESSION: Technically successful ultrasound-guided random core biopsy of the
liver.

## 2016-04-14 ENCOUNTER — Other Ambulatory Visit: Payer: Self-pay | Admitting: Neurology

## 2016-04-14 ENCOUNTER — Telehealth: Payer: Self-pay

## 2016-04-14 NOTE — Telephone Encounter (Signed)
PA for modafinil approved by [facility]. Will advise [facility] of this decision.

## 2016-04-14 NOTE — Telephone Encounter (Signed)
RX for [facility]il [facility]ed to [facility]. Received a receipt of confirmation.

## 2016-04-14 NOTE — Telephone Encounter (Signed)
PA for modafinil completed and sent to [facility]. Should have a determination in 3-5 days.

## 2016-05-31 ENCOUNTER — Other Ambulatory Visit: Payer: Self-pay

## 2016-05-31 MED ORDER — MODAFINIL 100 MG PO TABS: 100.0000 mg | ORAL_TABLET | Freq: Every day | ORAL | 0 refills | Status: DC

## 2016-05-31 NOTE — Telephone Encounter (Signed)
Received a fax from [facility] requesting a 90 day supply of modafinil for pt. Will send to Dr. Brett Fairy for review.

## 2016-05-31 NOTE — Telephone Encounter (Signed)
RX for modaf[facility]il faxed to [facility]. Received a receipt of confirmation.

## 2016-07-28 ENCOUNTER — Ambulatory Visit: Payer: BC Managed Care – PPO | Admitting: Neurology

## 2016-07-29 ENCOUNTER — Telehealth: Payer: Self-pay

## 2016-07-29 NOTE — Telephone Encounter (Signed)
I called pt twice to advise him that the office will be closed on 07/28/16. No answer both times and mailbox was full both times.

## 2016-07-29 NOTE — Telephone Encounter (Signed)
I called pt to get him rescheduled, pt is agreeable to an appt on 10/12/16 at 3:30pm. Pt verbalized understanding of new appt date and time.

## 2016-10-04 ENCOUNTER — Encounter: Payer: Self-pay | Admitting: Adult Health

## 2016-10-05 ENCOUNTER — Ambulatory Visit (INDEPENDENT_AMBULATORY_CARE_PROVIDER_SITE_OTHER): Payer: BC Managed Care – PPO | Admitting: Neurology

## 2016-10-05 ENCOUNTER — Encounter: Payer: Self-pay | Admitting: Neurology

## 2016-10-05 VITALS — BP 138/82 | HR 61 | Resp 20 | Ht 70.0 in | Wt 274.0 lb

## 2016-10-05 DIAGNOSIS — G4733 Obstructive sleep apnea (adult) (pediatric): Secondary | ICD-10-CM | POA: Diagnosis not present

## 2016-10-05 DIAGNOSIS — Z9989 Dependence on other enabling machines and devices: Secondary | ICD-10-CM

## 2016-10-05 MED ORDER — MODAFINIL 100 MG PO TABS: 100.0000 mg | ORAL_TABLET | Freq: Every day | ORAL | 1 refills | Status: DC

## 2016-10-05 NOTE — Progress Notes (Signed)
Chief Complaint  Patient presents with  . Follow-up    cpap    [facility] Resolution of obstructive sleep apnea at a CPAP setting of 11 cm water. The patient is highly motivated his compliance rate is 100% his sleepiness score has declined to 7 points. I would like for Steven Lee to continue the  CPAP use and pursue at the same time programs for weight loss, as this is his main risk for the sleep apnea.  I would like to add that the patient has a strong family history of sleep apnea in parents and siblings.  [facility] , CPAP compliance.   Provider:  Dr Brett Fairy Referring Provider: Velna Hatchet, MD Primary Care Physician:  Stacie Glaze, DO  Chief Complaint  Patient presents with  . Follow-up    cpap    HPI:  Steven Lee is a 36 y.o. male here as a referral from [place].  This 36 year old Caucasian right-handed male was originally referred for a sleep study on 06/05/2012. The patient has a history of obesity, GERD, and plantar fasciitis. He also reported having been diagnosed with obstructive sleep apnea and had been using a CPAP machine that he acquired from a family member.  He now needed a formal sleep evaluation to have the machine set to his knee. He presented with the complaints of excessive daytime sleepiness, snoring and witnessed apneas. He endorsed the sleepiness score at 14 points the backs score at 7 pints.  The sleep study showed an AHI also known as apnea hypopnea index of 97 and an RDI that was even higher.  The patient's lowest oxygen level was at SpO2  71%. The average heart rate was 53 beats per minute the patient was titrated to CPAP and responded well to a pressure of 10 cm water. The patient slept mostly in supine position and during the titration only in supine position. I ordered the CPAP to be set at 11 cm water was a 2 cm EPI. I obtained a detailed download from his machine through [facility] , which shows  an average AHI of 2.9 at a CPAP pressure of 11 cm, and average usage of the machine at night for 7 hours 53 minutes the 30 day download shows 100% compliance. Epworth was 7 Points oin 2014 .   In the history from 10/05/2016, Steven Lee is seen here for a yearly compliance visit with CPAP use, his last visit was with minus practitioner Ward Givens. Has used his CPAP 100% for the last 30 days every of those days over 4 hours with an average user time of 7 hours and 50 minutes and a residual AHI of only 1.1. This is an excellent resolution of apnea under CPAP pressure of 11 cm water. He does not have major air leaks, no evidence of periodic breathing of central apneas emerging from treatment. His Epworth sleepiness score was endorsed at 4 points and had been 13 points prior to CPAP, his fatigue severity score is 36 points down from 37 prior. Snoring and apnea has resolved.   Review of Systems: Out of a complete 14 system review, the patient complains of only the following symptoms, and all other reviewed systems are negative. Daytime naps; none.  Sleep is more  restorative.  Some nights with insomnia.    Social History   Social History  . Marital status: Married    Spouse name: Steven Lee  . Number of children: 0  . Years of education:  16   Occupational History  . 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 Smoking status: Never Smoker  . Smokeless tobacco: Current User  . Alcohol use 1.0 - 1.5 oz/week    2 - 3 drink(s) per week     Comment: occassionally  . Drug use: No  . Sexual activity: Yes    Partners: Female   Other Topics Concern  . Not on file   Social History Narrative   Lives with his wife.  Caffeine 3 cups daily.  BS Chartered loss adjuster.      Family History  Problem Relation Age of Onset  . Mental illness Sister     bipolar    Past Medical History:  Diagnosis Date  . Allergy   . Elevated liver enzymes  06-24-13   noted-had liver bx. "told nonalcoholic fatty liver"  . GERD (gastroesophageal reflux disease)   . Obesity   . OSA on CPAP    cpap used  . Plantar fasciitis 06-24-13   rt. foot    Past Surgical History:  Procedure Laterality Date  . EYE SURGERY     PRK bilaterally  . PARATHYROIDECTOMY N/A 06/27/2013   Procedure:  LEFT PARATHYROIDECTOMY ;  Surgeon: Earnstine Regal, MD;  Location: [facility];  Service: General;  Laterality: N/A;    Current Outpatient Prescriptions  Medication Sig Dispense Refill  . cholecalciferol (VITAMIN D) 1000 UNITS tablet Take 1,000 Units by mouth daily. Reported on 07/23/2015    . modafinil (PROVIGIL) 100 MG tablet Take 1 tablet (100 mg total) by mouth daily. 90 tablet 0  . Multiple Vitamin (MULTIVITAMIN) tablet Take 1 tablet by mouth daily.    Marland Kitchen omeprazole (PRILOSEC) 20 MG capsule Take 20 mg by mouth daily.     No current facility-administered medications for this visit.     Allergies as of 10/05/2016  . (No Known Allergies)    Vitals: BP 138/82   Pulse 61   Resp 20   Ht 5\' 10"  (1.778 m)   Wt 274 lb (124.3 kg)   BMI 39.31 kg/m  Last Weight:  Wt Readings from Last 1 Encounters:  10/05/16 274 lb (124.3 kg)   Last Height:   Ht Readings from Last 1 Encounters:  10/05/16 5\' 10"  (1.778 m)    Physical exam:  General: The patient is awake, alert and appears not in acute distress. The patient is well groomed. Head: Normocephalic, atraumatic. Neck is supple. Mallampati 1- neck circumference:18 inches, TMJ positive , clicking in the right. No retrognathia.  Cardiovascular:  Regular rate and rhythm, without  murmurs or carotid bruit, and without distended neck veins. Respiratory: Lungs are clear to auscultation.Skin:  Without evidence of edema, or rashTrunk: BMI is elevated  At BMI 39, morbidly obese, patient  has normal posture. Neurologic exam : The patient is awake and alert, oriented to place and time.  Cranial nerves: Pupils are equal and  briskly reactive to light.  Visual fields by finger perimetry are intact. Hearing to finger rub intact.  Facial sensation intact to fine touch. Facial motor strength is symmetric and tongue and uvula move midline.  Assessment:  After physical and neurologic examination, review of laboratory studies, imaging, neurophysiology testing and pre-existing records, assessment will be reviewed on the problem list.  Plan:  Treatment plan and additional workup :   The patient has OSA and is obese, his residual AHI on CPAP is low , and therefor apneas  not likely the cause of his current fatigue and sleepiness. The apneas too well controlled. He does not report cataplexy, sleep paralysis, dream intrusion or very visit dreams.   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 He has been 100% compliant and will follow [place] once a year for compliance visit. There have been no other neurologic problems or symptoms emerging.  His risk factor is OSA.    Cc Dr . Jennet Maduro

## 2016-10-12 ENCOUNTER — Ambulatory Visit: Payer: Self-pay | Admitting: Neurology

## 2017-02-20 ENCOUNTER — Other Ambulatory Visit: Payer: Self-pay | Admitting: Neurology

## 2017-02-20 ENCOUNTER — Telehealth: Payer: Self-pay | Admitting: Neurology

## 2017-02-20 MED ORDER — MODAFINIL 100 MG PO TABS: 100.0000 mg | ORAL_TABLET | Freq: Every day | ORAL | 1 refills | Status: DC

## 2017-02-20 NOTE — Telephone Encounter (Signed)
Patient requesting refill of  modafinil (PROVIGIL) 100 MG tablet. He says lost his Rx that was given to him at his last OV. Please call to [facility] on [address] in [place].

## 2017-10-11 ENCOUNTER — Encounter: Payer: Self-pay | Admitting: Adult Health

## 2017-10-11 ENCOUNTER — Ambulatory Visit: Payer: BC Managed Care – PPO | Admitting: Adult Health

## 2017-10-11 VITALS — BP 136/82 | HR 69 | Ht 70.0 in | Wt 307.2 lb

## 2017-10-11 DIAGNOSIS — G4733 Obstructive sleep apnea (adult) (pediatric): Secondary | ICD-10-CM | POA: Diagnosis not present

## 2017-10-11 DIAGNOSIS — Z9989 Dependence on other enabling machines and devices: Secondary | ICD-10-CM

## 2017-10-11 NOTE — Patient Instructions (Signed)
Your Plan:  Continue using cpap nightly Order for new machine sent to [facility]  Please call [facility] at [phone], and press option 1 when prompted. Their customer service representatives will be glad to assist you. If they are unable to answer, please leave a message and they will call you back. Make sure to leave your name and return phone number.      Thank you for coming to see [place] at [facility]. I hope we have been able to provide you high quality care today.  You may receive a patient satisfaction survey over the next few weeks. We would appreciate your feedback and comments so that we may continue to improve ourselves and the health of our patients.

## 2017-10-11 NOTE — Progress Notes (Signed)
Fax confirmation received for new cpap, [facility] [phone].

## 2017-10-11 NOTE — Progress Notes (Signed)
PATIENT: Steven Lee DOB: 1981/03/24  REASON FOR VISIT: follow up HISTORY FROM: patient  HISTORY OF PRESENT ILLNESS: Today 10/11/17 Steven Lee is a 37 year old male with a history of obstructive sleep apnea on CPAP.  He returns today for follow-up.  His CPAP download indicates that he uses machine every night for compliance of 100%.  He uses machine greater than 4 hours each night.  On average he uses his machine 7 hours and 42 minutes.  His residual AHI is 3.3 on 11 cm of water.  Overall he reports that he is doing well.  His Epworth sleepiness score is 7 and fatigue severity score 36.  He states that he does have a 51-month at home that keeps him very busy.  He reports that he is interested in a new machine.  HISTORY 07/23/15: Steven Lee  Is a 37 year old male with a history of obstructive sleep apnea on CPAP as well as daytime sleepiness. He returns today for follow-up. The patient compliance download is excellent. Download today indicates that he uses his machine 30 out of 30 days for compliance of 100%. On average he uses his machine 7 hours and 33 minutes. He uses machine greater than 4 hours for compliance of 93.3%. His residual AHI is 3.2 on 11 cm of water. The patient reports that he has not gone a day without using his CPAP. He states that he finds it very beneficial. His Epworth sleepiness score is 5 and fatigue severity score is 38. He states that he does use the Nuvigil however he does not use it daily. He states that he typically uses it if he does not sleep well or has to go on long car  Drives. He denies any new neurological symptoms. He returns today for an evaluation.   REVIEW OF SYSTEMS: Out of a complete 14 system review of symptoms, the patient complains only of the following symptoms, and all other reviewed systems are negative.  See HPI  ALLERGIES: No Known Allergies  HOME MEDICATIONS: Outpatient Medications Prior to Visit  Medication Sig Dispense Refill   . cholecalciferol (VITAMIN D) 1000 UNITS tablet Take 1,000 Units by mouth daily. Reported on 07/23/2015    . modafinil (PROVIGIL) 100 MG tablet Take 1 tablet (100 mg total) by mouth daily. (Patient taking differently: Take 100 mg by mouth daily as needed. ) 90 tablet 1  . Multiple Vitamin (MULTIVITAMIN) tablet Take 1 tablet by mouth daily.    Marland Kitchen omeprazole (PRILOSEC) 20 MG capsule Take 20 mg by mouth daily.     No facility-administered medications prior to visit.     PAST MEDICAL HISTORY: Past Medical History:  Diagnosis Date  . Allergy   . Elevated liver enzymes 06-24-13   noted-had liver bx. "told nonalcoholic fatty liver"  . GERD (gastroesophageal reflux disease)   . Obesity   . OSA on CPAP    cpap used  . Plantar fasciitis 06-24-13   rt. foot    PAST SURGICAL HISTORY: Past Surgical History:  Procedure Laterality Date  . EYE SURGERY     PRK bilaterally  . PARATHYROIDECTOMY N/A 06/27/2013   Procedure:  LEFT PARATHYROIDECTOMY ;  Surgeon: Earnstine Regal, MD;  Location: [facility];  Service: General;  Laterality: N/A;    FAMILY HISTORY: Family History  Problem Relation Age of Onset  . Mental illness Sister        bipolar    SOCIAL HISTORY: Social History   Socioeconomic History  . Marital status:  Married    Spouse name: Hilbert Corrigan  . Number of children: 0  . Years of education: 64  . Highest education level: Not on file  Occupational History  . Occupation: Product manager: [employer]: [employer]  Social Needs  . Financial resource strain: Not on file  . Food insecurity:    Worry: Not on file    Inability: Not on file  . Transportation needs:    Medical: Not on file    Non-medical: Not on file  Tobacco Use  . Smoking status: Never Smoker  . Smokeless tobacco: Current User  Substance and Sexual Activity  . Alcohol use: Yes    Alcohol/week: 1.0 - 1.5 oz    Types: 2 - 3 drink(s) per week    Comment: occassionally   . Drug use: No  . Sexual activity: Yes    Partners: Female  Lifestyle  . Physical activity:    Days per week: Not on file    Minutes per session: Not on file  . Stress: Not on file  Relationships  . Social connections:    Talks on phone: Not on file    Gets together: Not on file    Attends religious service: Not on file    Active member of club or organization: Not on file    Attends meetings of clubs or organizations: Not on file    Relationship status: Not on file  . Intimate partner violence:    Fear of current or ex partner: Not on file    Emotionally abused: Not on file    Physically abused: Not on file    Forced sexual activity: Not on file  Other Topics Concern  . Not on file  Social History Narrative   Lives with his wife.  Caffeine 3 cups daily.  BS Chartered loss adjuster.        PHYSICAL EXAM  Vitals:   10/11/17 1450  BP: 136/82  Pulse: 69  Weight: (!) 307 lb 3.2 oz (139.3 kg)  Height: 5\' 10"  (1.778 m)   Body mass index is 44.08 kg/m.  Generalized: Well developed, in no acute distress   Neurological examination  Mentation: Alert oriented to time, place, history taking. Follows all commands speech and language fluent Cranial nerve II-XII: Pupils were equal round reactive to light. Extraocular movements were full, visual field were full on confrontational test. Facial sensation and strength were normal. Uvula tongue midline. Head turning and shoulder shrug  were normal and symmetric.  Neck circumference 17 inches, Mallampati 4+ Motor: The motor testing reveals 5 over 5 strength of all 4 extremities. Good symmetric motor tone is noted throughout.  Sensory: Sensory testing is intact to soft touch on all 4 extremities. No evidence of extinction is noted.  Coordination: Cerebellar testing reveals good finger-nose-finger and heel-to-shin bilaterally.  Gait and station: Gait is normal.  Reflexes: Deep tendon reflexes are symmetric and normal bilaterally.    DIAGNOSTIC DATA (LABS, IMAGING, TESTING) - I reviewed patient records, labs, notes, testing and imaging myself where available.  Lab Results  Component Value Date   WBC 4.8 06/24/2013   HGB 14.7 06/24/2013   HCT 40.6 06/24/2013   MCV 86.8 06/24/2013   PLT 225 06/24/2013      Component Value Date/Time   NA 139 06/24/2013 1430   K 4.1 06/24/2013 1430   CL 104 06/24/2013 1430   CO2 23 06/24/2013 1430   GLUCOSE 90 06/24/2013 1430  BUN 16 06/24/2013 1430   CREATININE 0.87 06/24/2013 1430   CALCIUM 10.0 09/23/2013 1456   PROT 7.5 06/24/2013 1430   ALBUMIN 4.1 06/24/2013 1430   AST 64 (H) 06/24/2013 1430   ALT 164 (H) 06/24/2013 1430   ALKPHOS 83 06/24/2013 1430   BILITOT 0.3 06/24/2013 1430   GFRNONAA >90 06/24/2013 1430   GFRAA >90 06/24/2013 1430      ASSESSMENT AND PLAN 37 y.o. year old male  has a past medical history of Allergy, Elevated liver enzymes (06-24-13), GERD (gastroesophageal reflux disease), Obesity, OSA on CPAP, and Plantar fasciitis (06-24-13). here with :  1.  Obstructive sleep apnea on CPAP  The patient CPAP download shows excellent compliance and good treatment of his apnea.  He is encouraged to continue using his CPAP nightly.  Advised that I will send an order to his DME company for a new machine.  He will follow-up in 1 more or sooner if needed.   I spent 15 minutes with the patient. 50% of this time was spent reviewing his CPAP download   Ward Givens, MSN, NP-C 10/11/2017, 3:17 PM [facility] [address], [place], [place] [address] [phone]

## 2017-10-17 ENCOUNTER — Telehealth: Payer: Self-pay | Admitting: *Deleted

## 2017-10-17 NOTE — Telephone Encounter (Signed)
Order for new cpap received from [facility].  Pt has deductible $1250.00 to meet this yr.  New machine , heated humidifer, supplies will be $1039.46.  Pt will wait later in yr to see if cost has decreased.

## 2018-01-08 ENCOUNTER — Telehealth: Payer: Self-pay | Admitting: *Deleted

## 2018-01-08 ENCOUNTER — Other Ambulatory Visit: Payer: Self-pay | Admitting: Adult Health

## 2018-01-08 MED ORDER — MODAFINIL 100 MG PO TABS: 100.0000 mg | ORAL_TABLET | Freq: Every day | ORAL | 1 refills | Status: DC

## 2018-01-08 NOTE — Telephone Encounter (Signed)
Submitted PA. Waiting on determination.   "Your information has been submitted to [place]. To check for an updated outcome later, reopen this PA request from your dashboard. If [facility] has not responded to your request within 24 hours, contact [place] at [phone]."

## 2018-01-08 NOTE — Telephone Encounter (Signed)
Pt is due for a refill on modafinil and up to date on his appts. [place] [facility] checked.

## 2018-01-08 NOTE — Telephone Encounter (Signed)
RX for modafinil was accidentally printed. I faxed RX to [facility]. Received a receipt of confirmation.

## 2018-01-08 NOTE — Telephone Encounter (Signed)
Pt called requesting a refill for modafinil (PROVIGIL) 100 MG tablet sent to [facility]

## 2018-01-08 NOTE — Telephone Encounter (Signed)
Pt called inquiring about status of rx. Pt failed to let previous phone rep know that he is leaving to go out of town early afternoon, pt said in about 30 min. He is wanting to know if rx can be sent in asap.

## 2018-01-08 NOTE — Telephone Encounter (Signed)
I called pt, advised him that the modafinil refill has been sent to [facility]. Pt verbalized understanding.

## 2018-01-08 NOTE — Telephone Encounter (Signed)
Initiated PA modafinil 100mg  tablet on covermymeds. Key: [id number] - PA Case ID: [id number] - Rx #: [id number]. In process of completing.

## 2018-01-09 NOTE — Telephone Encounter (Signed)
PA approved effective 01/08/18-01/09/19. PA# [place] [phone] Non-grandfathered [id number].   Faxed notice of approval to [facility] at [phone]. Received fax confirmation.

## 2018-07-29 ENCOUNTER — Other Ambulatory Visit: Payer: Self-pay | Admitting: Adult Health

## 2018-10-15 ENCOUNTER — Ambulatory Visit: Payer: BC Managed Care – PPO | Admitting: Adult Health

## 2018-10-15 ENCOUNTER — Telehealth: Payer: Self-pay

## 2018-10-15 ENCOUNTER — Other Ambulatory Visit: Payer: Self-pay

## 2018-10-15 NOTE — Telephone Encounter (Signed)
Unable to get a fax from [facility] with the compliance report due to them not being able to pull the machine's serial number up in their system. Janett Billow, NP made aware and she would like the patient to reschedule with Ward Givens, NP in 3 months.   Unable to get in contact with the patient. A mychart message was sent

## 2019-01-10 ENCOUNTER — Telehealth: Payer: Self-pay | Admitting: *Deleted

## 2019-01-10 NOTE — Telephone Encounter (Signed)
Called pt as could not pull download of cpap wirelessly.  He stated that he got machine from another company? Name.  Had issues with company so switched to [facility] (where he gets supplies now).  He will bring machine or chip and serial # to machine so maybe this could be given to [facility] for future reference if needed.  His machine is 6-39 yrs old (not a remote enabled machine).

## 2019-01-13 ENCOUNTER — Encounter: Payer: Self-pay | Admitting: Adult Health

## 2019-01-14 ENCOUNTER — Other Ambulatory Visit: Payer: Self-pay

## 2019-01-14 ENCOUNTER — Encounter: Payer: Self-pay | Admitting: Adult Health

## 2019-01-14 ENCOUNTER — Ambulatory Visit: Payer: BC Managed Care – PPO | Admitting: Adult Health

## 2019-01-14 VITALS — BP 127/82 | HR 72 | Temp 96.9°F | Ht 70.0 in | Wt 342.6 lb

## 2019-01-14 DIAGNOSIS — G4733 Obstructive sleep apnea (adult) (pediatric): Secondary | ICD-10-CM

## 2019-01-14 DIAGNOSIS — Z9989 Dependence on other enabling machines and devices: Secondary | ICD-10-CM | POA: Diagnosis not present

## 2019-01-14 NOTE — Progress Notes (Signed)
PATIENT: Steven Lee DOB: 1980/08/12  REASON FOR VISIT: follow up HISTORY FROM: patient  HISTORY OF PRESENT ILLNESS: Today 01/14/19 :  Steven Lee is a 38 year old male with a history of obstructive sleep apnea on CPAP.  His CPAP download indicates that he uses machine 30 out of 30 days for compliance of 100%.  He uses machine greater than 4 hours each night.  On average he uses his machine 8 hours and 52 minutes.  His residual AHI is 2.7 on 11 cm of water with EPR of 2.  He has a slight leak with his machine.  He states that he sometimes can feel it leaking but not typically.  He would like a new machine however last year his deductible was high.  He returns today for follow-up.  HISTORY 10/11/17 Steven Lee is a 38 year old male with a history of obstructive sleep apnea on CPAP.  He returns today for follow-up.  His CPAP download indicates that he uses machine every night for compliance of 100%.  He uses machine greater than 4 hours each night.  On average he uses his machine 7 hours and 42 minutes.  His residual AHI is 3.3 on 11 cm of water.  Overall he reports that he is doing well.  His Epworth sleepiness score is 7 and fatigue severity score 36.  He states that he does have a 14-month at home that keeps him very busy.  He reports that he is interested in a new machine.  REVIEW OF SYSTEMS: Out of a complete 14 system review of symptoms, the patient complains only of the following symptoms, and all other reviewed systems are negative.  See HPI  ALLERGIES: Allergies  Allergen Reactions  . Pollen Extract Other (See Comments)    Seasonal Allergy , reaction=hay fever    HOME MEDICATIONS: Outpatient Medications Prior to Visit  Medication Sig Dispense Refill  . cetirizine (ZYRTEC) 10 MG chewable tablet Chew 10 mg by mouth daily. OTC aller-tec generic for Zyrtec. From [facility]    . cholecalciferol (VITAMIN D) 1000 UNITS tablet Take 1,000 Units by mouth daily. Reported on  07/23/2015    . modafinil (PROVIGIL) 100 MG tablet TAKE 1 TABLET BY MOUTH DAILY 90 tablet 0  . Multiple Vitamin (MULTIVITAMIN) tablet Take 1 tablet by mouth daily.    Marland Kitchen omeprazole (PRILOSEC) 20 MG capsule Take 20 mg by mouth daily.     No facility-administered medications prior to visit.     PAST MEDICAL HISTORY: Past Medical History:  Diagnosis Date  . Allergy   . Elevated liver enzymes 06-24-13   noted-had liver bx. "told nonalcoholic fatty liver"  . GERD (gastroesophageal reflux disease)   . Obesity   . OSA on CPAP    cpap used  . Plantar fasciitis 06-24-13   rt. foot    PAST SURGICAL HISTORY: Past Surgical History:  Procedure Laterality Date  . EYE SURGERY     PRK bilaterally  . PARATHYROIDECTOMY N/A 06/27/2013   Procedure:  LEFT PARATHYROIDECTOMY ;  Surgeon: Earnstine Regal, MD;  Location: [facility];  Service: General;  Laterality: N/A;    FAMILY HISTORY: Family History  Problem Relation Age of Onset  . Mental illness Sister        bipolar    SOCIAL HISTORY: Social History   Socioeconomic History  . Marital status: Married    Spouse name: Hilbert Corrigan  . Number of children: 0  . Years of education: 16  . Highest education level:  Not on file  Occupational History  . Occupation: Product manager: [employer]: [employer]  Social Needs  . Financial resource strain: Not on file  . Food insecurity    Worry: Not on file    Inability: Not on file  . Transportation needs    Medical: Not on file    Non-medical: Not on file  Tobacco Use  . Smoking status: Never Smoker  . Smokeless tobacco: Current User  Substance and Sexual Activity  . Alcohol use: Yes    Alcohol/week: 2.0 - 3.0 standard drinks    Types: 2 - 3 drink(s) per week    Comment: occassionally  . Drug use: No  . Sexual activity: Yes    Partners: Female  Lifestyle  . Physical activity    Days per week: Not on file    Minutes per session: Not on file  .  Stress: Not on file  Relationships  . Social Herbalist on phone: Not on file    Gets together: Not on file    Attends religious service: Not on file    Active member of club or organization: Not on file    Attends meetings of clubs or organizations: Not on file    Relationship status: Not on file  . Intimate partner violence    Fear of current or ex partner: Not on file    Emotionally abused: Not on file    Physically abused: Not on file    Forced sexual activity: Not on file  Other Topics Concern  . Not on file  Social History Narrative   Lives with his wife.  Caffeine 3 cups daily.  BS Chartered loss adjuster.        PHYSICAL EXAM  Vitals:   01/14/19 1324  BP: 127/82  Pulse: 72  Temp: (!) 96.9 F (36.1 C)  Weight: (!) 342 lb 9.6 oz (155.4 kg)  Height: 5\' 10"  (1.778 m)   Body mass index is 49.16 kg/m.  Generalized: Well developed, in no acute distress   Neurological examination  Mentation: Alert oriented to time, place, history taking. Follows all commands speech and language fluent Cranial nerve II-XII: Pupils were equal round reactive to light. Extraocular movements were full, visual field were full on confrontational test. Facial sensation and strength were normal. Uvula tongue midline. Head turning and shoulder shrug  were normal and symmetric. Motor: The motor testing reveals 5 over 5 strength of all 4 extremities. Good symmetric motor tone is noted throughout.  Sensory: Sensory testing is intact to soft touch on all 4 extremities. No evidence of extinction is noted.  Coordination: Cerebellar testing reveals good finger-nose-finger and heel-to-shin bilaterally.  Gait and station: Gait is normal.  Reflexes: Deep tendon reflexes are symmetric and normal bilaterally.   DIAGNOSTIC DATA (LABS, IMAGING, TESTING) - I reviewed patient records, labs, notes, testing and imaging myself where available.  Lab Results  Component Value Date   WBC 4.8 06/24/2013   HGB  14.7 06/24/2013   HCT 40.6 06/24/2013   MCV 86.8 06/24/2013   PLT 225 06/24/2013      Component Value Date/Time   NA 139 06/24/2013 1430   K 4.1 06/24/2013 1430   CL 104 06/24/2013 1430   CO2 23 06/24/2013 1430   GLUCOSE 90 06/24/2013 1430   BUN 16 06/24/2013 1430   CREATININE 0.87 06/24/2013 1430   CALCIUM 10.0 09/23/2013 1456   PROT 7.5 06/24/2013 1430  ALBUMIN 4.1 06/24/2013 1430   AST 64 (H) 06/24/2013 1430   ALT 164 (H) 06/24/2013 1430   ALKPHOS 83 06/24/2013 1430   BILITOT 0.3 06/24/2013 1430   GFRNONAA >90 06/24/2013 1430   GFRAA >90 06/24/2013 1430      ASSESSMENT AND PLAN 38 y.o. year old male  has a past medical history of Allergy, Elevated liver enzymes (06-24-13), GERD (gastroesophageal reflux disease), Obesity, OSA on CPAP, and Plantar fasciitis (06-24-13). here with :  1.  Obstructive sleep apnea on CPAP  The patient's CPAP download shows excellent compliance and good treatment of his apnea.  He is encouraged to continue using CPAP nightly and greater than 4 hours each night.  I have sent an order for a new machine.  He is advised that if his symptoms worsen or he develops new symptoms he should let us know.  He will follow-up in 1 year or sooner if needed    I spent 15 minutes with the patient. 50% of this time was spent reviewing CPAP download   Ward Givens, MSN, NP-C 01/14/2019, 1:45 PM [facility] [address] [place], [place] [address] [phone]

## 2019-01-14 NOTE — Patient Instructions (Addendum)

## 2019-01-14 NOTE — Telephone Encounter (Signed)
noted 

## 2019-01-14 NOTE — Progress Notes (Signed)
[facility] received new cpap orders for new cpap machine.  sy

## 2019-09-17 ENCOUNTER — Other Ambulatory Visit: Payer: Self-pay | Admitting: Adult Health

## 2019-09-23 ENCOUNTER — Telehealth: Payer: Self-pay | Admitting: Neurology

## 2019-09-23 NOTE — Telephone Encounter (Signed)
PA completed cover my meds [id number] Will wait for response.

## 2019-09-24 NOTE — Telephone Encounter (Signed)
Received fax from [facility]: Modafinil approved 09/23/19 - 09/22/2020.

## 2019-10-09 ENCOUNTER — Telehealth: Payer: Self-pay | Admitting: Adult Health

## 2019-10-09 DIAGNOSIS — G4733 Obstructive sleep apnea (adult) (pediatric): Secondary | ICD-10-CM

## 2019-10-09 NOTE — Telephone Encounter (Signed)
Last saw pt in July 2020, has appt in 01/2020.  Ok to do this now?

## 2019-10-09 NOTE — Telephone Encounter (Signed)
CMM sent to [facility] for new cpap order.  Emailed pt as well.

## 2019-10-09 NOTE — Telephone Encounter (Signed)
Patient called wanting to know if he can be prescribed a new cpap machine States his current machine is 40yrs old and the bottom of the water reserve is coming off. Pt would need prescription sent to [facility] in [place]

## 2019-10-09 NOTE — Telephone Encounter (Signed)
Order placed

## 2020-01-15 ENCOUNTER — Ambulatory Visit: Payer: BC Managed Care – PPO | Admitting: Adult Health

## 2020-02-25 ENCOUNTER — Encounter: Payer: Self-pay | Admitting: Neurology

## 2020-02-25 ENCOUNTER — Ambulatory Visit: Payer: BC Managed Care – PPO | Admitting: Neurology

## 2020-02-25 ENCOUNTER — Other Ambulatory Visit: Payer: Self-pay

## 2020-02-25 VITALS — BP 122/76 | HR 59 | Ht 70.0 in | Wt 253.0 lb

## 2020-02-25 DIAGNOSIS — E139 Other specified diabetes mellitus without complications: Secondary | ICD-10-CM

## 2020-02-25 DIAGNOSIS — Z9989 Dependence on other enabling machines and devices: Secondary | ICD-10-CM

## 2020-02-25 DIAGNOSIS — G4733 Obstructive sleep apnea (adult) (pediatric): Secondary | ICD-10-CM

## 2020-02-25 NOTE — Patient Instructions (Signed)
Liraglutide injection (Weight Management) What is this medicine? LIRAGLUTIDE (LIR a GLOO tide) is used to help people lose weight and maintain weight loss. It is used with a reduced-calorie diet and exercise. This medicine may be used for other purposes; ask your health care provider or pharmacist if you have questions. COMMON BRAND NAME(S): Saxenda What should I tell my health care provider before I take this medicine? They need to know if you have any of these conditions:  endocrine tumors (MEN 2) or if someone in your family had these tumors  gallbladder disease  high cholesterol  history of alcohol abuse problem  history of pancreatitis  kidney disease or if you are on dialysis  liver disease  previous swelling of the tongue, face, or lips with difficulty breathing, difficulty swallowing, hoarseness, or tightening of the throat  stomach problems  suicidal thoughts, plans, or attempt; a previous suicide attempt by you or a family member  thyroid cancer or if someone in your family had thyroid cancer  an unusual or allergic reaction to liraglutide, other medicines, foods, dyes, or preservatives  pregnant or trying to get pregnant  breast-feeding How should I use this medicine? This medicine is for injection under the skin of your upper leg, stomach area, or upper arm. You will be taught how to prepare and give this medicine. Use exactly as directed. Take your medicine at regular intervals. Do not take it more often than directed. This drug comes with INSTRUCTIONS FOR USE. Ask your pharmacist for directions on how to use this drug. Read the information carefully. Talk to your pharmacist or health care provider if you have questions. It is important that you put your used needles and syringes in a special sharps container. Do not put them in a trash can. If you do not have a sharps container, call your pharmacist or healthcare provider to get one. A special MedGuide will be  given to you by the pharmacist with each prescription and refill. Be sure to read this information carefully each time. Talk to your pediatrician regarding the use of this medicine in children. Special care may be needed. Overdosage: If you think you have taken too much of this medicine contact a poison control center or emergency room at once. NOTE: This medicine is only for you. Do not share this medicine with others. What if I miss a dose? If you miss a dose, take it as soon as you can. If it is almost time for your next dose, take only that dose. Do not take double or extra doses. If you miss your dose for 3 days or more, call your doctor or health care professional to talk about how to restart this medicine. What may interact with this medicine?  insulin and other medicines for diabetes This list may not describe all possible interactions. Give your health care provider a list of all the medicines, herbs, non-prescription drugs, or dietary supplements you use. Also tell them if you smoke, drink alcohol, or use illegal drugs. Some items may interact with your medicine. What should I watch for while using this medicine? Visit your doctor or health care professional for regular checks on your progress. Drink plenty of fluids while taking this medicine. Check with your doctor or health care professional if you get an attack of severe diarrhea, nausea, and vomiting. The loss of too much body fluid can make it dangerous for you to take this medicine. This medicine may affect blood sugar levels. Ask your healthcare   provider if changes in diet or medicines are needed if you have diabetes. Patients and their families should watch out for worsening depression or thoughts of suicide. Also watch out for sudden changes in feelings such as feeling anxious, agitated, panicky, irritable, hostile, aggressive, impulsive, severely restless, overly excited and hyperactive, or not being able to sleep. If this happens,  especially at the beginning of treatment or after a change in dose, call your health care professional. Women should inform their health care provider if they wish to become pregnant or think they might be pregnant. Losing weight while pregnant is not advised and may cause harm to the unborn child. Talk to your health care provider for more information. What side effects may I notice from receiving this medicine? Side effects that you should report to your doctor or health care professional as soon as possible:  allergic reactions like skin rash, itching or hives, swelling of the face, lips, or tongue  breathing problems  diarrhea that continues or is severe  lump or swelling on the neck  severe nausea  signs and symptoms of infection like fever or chills; cough; sore throat; pain or trouble passing urine  signs and symptoms of low blood sugar such as feeling anxious; confusion; dizziness; increased hunger; unusually weak or tired; increased sweating; shakiness; cold, clammy skin; irritable; headache; blurred vision; fast heartbeat; loss of consciousness  signs and symptoms of kidney injury like trouble passing urine or change in the amount of urine  trouble swallowing  unusual stomach upset or pain  vomiting Side effects that usually do not require medical attention (report to your doctor or health care professional if they continue or are bothersome):  constipation  decreased appetite  diarrhea  fatigue  headache  nausea  pain, redness, or irritation at site where injected  stomach upset  stuffy or runny nose This list may not describe all possible side effects. Call your doctor for medical advice about side effects. You may report side effects to FDA at 1-800-FDA-1088. Where should I keep my medicine? Keep out of the reach of children. Store unopened pen in a refrigerator between 2 and 8 degrees C (36 and 46 degrees F). Do not freeze or use if the medicine has been  frozen. Protect from light and excessive heat. After you first use the pen, it can be stored at room temperature between 15 and 30 degrees C (59 and 86 degrees F) or in a refrigerator. Throw away your used pen after 30 days or after the expiration date, whichever comes first. Do not store your pen with the needle attached. If the needle is left on, medicine may leak from the pen. NOTE: This sheet is a summary. It may not cover all possible information. If you have questions about this medicine, talk to your doctor, pharmacist, or health care provider.  2020 Elsevier/Gold Standard (2019-05-02 21:16:59)  

## 2020-02-25 NOTE — Progress Notes (Signed)
PATIENT: Steven Lee DOB: 27-Jun-1981  REASON FOR VISIT: follow up HISTORY FROM: patient  HISTORY OF PRESENT ILLNESS: Today 02/25/20 :  I have the pleasure of seeing Mr. Steven Lee today a 39 year old gentleman who was in need of a replacement machine earlier this year after his water reservoir broke.  He has been using compliantly CPAP in the treatment of OSA and clear days download reveals 100% compliance by days and hours his CPAP is set at 11 cmH2O pressure was 2 cm EPR is average use at time of 7 hours 35 minutes and his residual AHI is 0.8/h.  The 95th percentile air leak is extremely low 1.2 L which is a good seal for any interface.  Based on this he is highly compliant and his Epworth sleepiness score was endorsed at only 9 point his fatigue severity score at 21 points.  There have been no other medical issues and no neurologic questions.  I would also like to add that in comparison to our first visit this patient has made great efforts to lose weight and his current BMI is 36.  I originally encountered him but he was well over 40.  Blood pressure has been in normal range  Mr. Steven Lee is a 39 year old male with a history of obstructive sleep apnea on CPAP.  His CPAP download indicates that he uses machine 30 out of 30 days for compliance of 100%.  He uses machine greater than 4 hours each night.  On average he uses his machine 8 hours and 52 minutes.  His residual AHI is 2.7 on 11 cm of water with EPR of 2.  He has a slight leak with his machine.  He states that he sometimes can feel it leaking but not typically.  He would like a new machine however last year his deductible was high.  He returns today for follow-up.  HISTORY 10/11/17 Mr. Steven Lee is a 39 year old male with a history of obstructive sleep apnea on CPAP.  He returns today for follow-up.  His CPAP download indicates that he uses machine every night for compliance of 100%.  He uses machine greater than 4  hours each night.  On average he uses his machine 7 hours and 42 minutes.  His residual AHI is 3.3 on 11 cm of water.  Overall he reports that he is doing well.  His Epworth sleepiness score is 7 and fatigue severity score 36.  He states that he does have a 39-month at home that keeps him very busy.  He reports that he is interested in a new machine.  REVIEW OF SYSTEMS: Out of a complete 14 system review of symptoms, the patient complains only of the following symptoms, and all other reviewed systems are negative.  Speech disorder, stammering.   See HPI  No flowsheet data found.  How likely are you to doze in the following situations: 0 = not likely, 1 = slight chance, 2 = moderate chance, 3 = high chance  Sitting and Reading? Watching Television? Sitting inactive in a public place (theater or meeting)? Lying down in the afternoon when circumstances permit? Sitting and talking to someone? Sitting quietly after lunch without alcohol? In a car, while stopped for a few minutes in traffic? As a passenger in a car for an hour without a break?  Total = 9/ 24    ALLERGIES: Allergies  Allergen Reactions  . Pollen Extract Other (See Comments)    Seasonal Allergy , reaction=hay fever  HOME MEDICATIONS: Outpatient Medications Prior to Visit  Medication Sig Dispense Refill  . cetirizine (ZYRTEC) 10 MG chewable tablet Chew 10 mg by mouth daily. OTC aller-tec generic for Zyrtec. From [facility]    . cholecalciferol (VITAMIN D) 1000 UNITS tablet Take 1,000 Units by mouth daily. Reported on 07/23/2015    . modafinil (PROVIGIL) 100 MG tablet TAKE 1 TABLET BY MOUTH DAILY 90 tablet 1  . Multiple Vitamin (MULTIVITAMIN) tablet Take 1 tablet by mouth daily.    Marland Kitchen omeprazole (PRILOSEC) 20 MG capsule Take 20 mg by mouth daily.    Marland Kitchen SAXENDA 18 MG/3ML SOPN Inject 3 mg into the skin daily.     No facility-administered medications prior to visit.    PAST MEDICAL HISTORY: Past Medical History:    Diagnosis Date  . Allergy   . Elevated liver enzymes 06-24-13   noted-had liver bx. "told nonalcoholic fatty liver"  . GERD (gastroesophageal reflux disease)   . Obesity   . OSA on CPAP    cpap used  . Plantar fasciitis 06-24-13   rt. foot    PAST SURGICAL HISTORY: Past Surgical History:  Procedure Laterality Date  . EYE SURGERY     PRK bilaterally  . PARATHYROIDECTOMY N/A 06/27/2013   Procedure:  LEFT PARATHYROIDECTOMY ;  Surgeon: Earnstine Regal, MD;  Location: [facility];  Service: General;  Laterality: N/A;    FAMILY HISTORY: Family History  Problem Relation Age of Onset  . Mental illness Sister        bipolar    SOCIAL HISTORY: Social History   Socioeconomic History  . Marital status: Married    Spouse name: Hilbert Corrigan  . Number of children: 0  . Years of education: 48  . Highest education level: Not on file  Occupational History  . 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 Smoking status: Never Smoker  . Smokeless tobacco: Current User  Substance and Sexual Activity  . Alcohol use: Yes    Alcohol/week: 2.0 - 3.0 standard drinks    Types: 2 - 3 drink(s) per week    Comment: occassionally  . Drug use: No  . Sexual activity: Yes    Partners: Female  Other Topics Concern  . Not on file  Social History Narrative   Lives with his wife.  Caffeine 3 cups daily.  BS Chartered loss adjuster.     Social Determinants of Health   Financial Resource Strain:   . Difficulty of Paying Living Expenses:   Food Insecurity:   . Worried About Charity fundraiser in the Last Year:   . Arboriculturist in the Last Year:   Transportation Needs:   . Film/video editor (Medical):   Marland Kitchen Lack of Transportation (Non-Medical):   Physical Activity:   . Days of Exercise per Week:   . Minutes of Exercise per Session:   Stress:   . Feeling of Stress :   Social Connections:   . Frequency of Communication  with Friends and Family:   . Frequency of Social Gatherings with Friends and Family:   . Attends Religious Services:   . Active Member of Clubs or Organizations:   . Attends Archivist Meetings:   Marland Kitchen Marital Status:   Intimate Partner Violence:   . Fear of Current or Ex-Partner:   . Emotionally Abused:   Marland Kitchen Physically Abused:   . Sexually Abused:  PHYSICAL EXAM  Vitals:   02/25/20 1532  BP: 122/76  Pulse: (!) 59  Weight: 253 lb (114.8 kg)  Height: 5\' 10"  (1.778 m)   Body mass index is 36.3 kg/m.   Lost 85 pounds on Saxenda since 07-2019>   Generalized: Well developed, in no acute distress   Neck size 17.45"   Neurological examination  Mentation: Alert and oriented to time, place, history taking.  Cranial nerve: no loss of smell or taste- Pupils were equal in size and are round , reactive to light.  Uvula tongue midline. Head turning and shoulder shrug  were normal and symmetric. Motor: 5 / 5 strength of all 4 extremities. Good symmetric motor tone is noted throughout.  Sensory: Coordination: deferred.  DIAGNOSTIC DATA (LABS, IMAGING, TESTING) - I reviewed patient records, labs, notes, testing and imaging myself where available.   Primary care through [facility] 39 y.o. year old male  has a past medical history of Allergy, Elevated liver enzymes (06-24-13), GERD (gastroesophageal reflux disease), Obesity, OSA on CPAP, and Plantar fasciitis (06-24-13). here with :  1.  Obstructive sleep apnea on CPAP, new machine, 100% compliant, great resolution.   The patient's CPAP download shows excellent compliance and good treatment of his apnea.    He will follow-up in 1 year or sooner if needed    I spent 15 minutes with the patient all of this time was spent reviewing CPAP download    02/25/2020, 4:01 PM [facility] [address], [place], [place] [address] [phone]

## 2020-04-14 ENCOUNTER — Other Ambulatory Visit: Payer: Self-pay | Admitting: Adult Health

## 2020-10-08 ENCOUNTER — Telehealth: Payer: Self-pay | Admitting: *Deleted

## 2020-10-08 NOTE — Telephone Encounter (Signed)
Modafinil PA, key: [id number],  Your information has been submitted to [facility].  If [facility] has not responded to your request within 24 hours, contact [place] at [phone].

## 2020-10-12 NOTE — Telephone Encounter (Signed)
PA approved through [facility]-10/08/2020 - 10/08/2021

## 2021-02-22 ENCOUNTER — Ambulatory Visit: Payer: Self-pay | Admitting: Adult Health

## 2021-02-22 ENCOUNTER — Encounter: Payer: Self-pay | Admitting: Adult Health

## 2021-06-02 ENCOUNTER — Ambulatory Visit: Payer: Self-pay | Admitting: Neurology

## 2024-01-27 ENCOUNTER — Other Ambulatory Visit: Payer: Self-pay | Admitting: Medical Genetics

## 2024-01-30 ENCOUNTER — Other Ambulatory Visit (HOSPITAL_COMMUNITY)
Admission: RE | Admit: 2024-01-30 | Discharge: 2024-01-30 | Disposition: A | Discharge status: Home / Self Care | Payer: Self-pay | Source: Ambulatory Visit | Attending: Medical Genetics | Admitting: Medical Genetics

## 2024-02-09 LAB — GENECONNECT MOLECULAR SCREEN: Genetic Analysis Overall Interpretation: NEGATIVE
